# Patient Record
Sex: Male | Born: 1995 | Race: Black or African American | Hispanic: No | Marital: Single | Smoking: Never smoker
Health system: Southern US, Community
[De-identification: ages and names within clinical notes are randomized; demographics above are authoritative.]

---

## 2016-09-10 ENCOUNTER — Emergency Department
Admission: EM | Admit: 2016-09-10 | Discharge: 2016-09-10 | Disposition: A | Discharge status: Home / Self Care | Payer: Self-pay | Attending: Emergency Medicine | Admitting: Emergency Medicine

## 2016-09-10 ENCOUNTER — Encounter: Payer: Self-pay | Admitting: Emergency Medicine

## 2016-09-10 ENCOUNTER — Emergency Department: Payer: Self-pay

## 2016-09-10 DIAGNOSIS — Y929 Unspecified place or not applicable: Secondary | ICD-10-CM | POA: Insufficient documentation

## 2016-09-10 DIAGNOSIS — Y939 Activity, unspecified: Secondary | ICD-10-CM | POA: Insufficient documentation

## 2016-09-10 DIAGNOSIS — S61012A Laceration without foreign body of left thumb without damage to nail, initial encounter: Secondary | ICD-10-CM | POA: Insufficient documentation

## 2016-09-10 DIAGNOSIS — Y999 Unspecified external cause status: Secondary | ICD-10-CM | POA: Insufficient documentation

## 2016-09-10 DIAGNOSIS — W269XXA Contact with unspecified sharp object(s), initial encounter: Secondary | ICD-10-CM | POA: Insufficient documentation

## 2016-09-10 DIAGNOSIS — Z23 Encounter for immunization: Secondary | ICD-10-CM | POA: Insufficient documentation

## 2016-09-10 MED ORDER — TETANUS-DIPHTH-ACELL PERTUSSIS 5-2.5-18.5 LF-MCG/0.5 IM SUSP
0.5000 mL | Freq: Once | INTRAMUSCULAR | Status: AC
  Administered 2016-09-10: 0.5 mL via INTRAMUSCULAR
  Filled 2016-09-10: qty 0.5

## 2016-09-10 NOTE — ED Notes (Signed)
Pt discharged to home.  Discharge instructions reviewed.  Verbalized understanding.  No questions or concerns at this time.  Teach back verified.  Pt in NAD.  No items left in ED.   

## 2016-09-10 NOTE — ED Provider Notes (Signed)
[facility] Emergency Department Provider Note  ____________________________________________  Time seen: Approximately 5:10 PM  I have reviewed the triage vital signs and the nursing notes.   HISTORY  Chief Complaint Foreign Body    HPI Greg Benjamin is a 20 y.o. male presents emergency department complaining of possible foreign body to his left eye. Patient states that he believes a piece of glass is in his thumb. Thumb is lacerated approximately a week ago. The patient has a arcus spot underneath the skin with a palpable difference underlying the skin. Patient is also concerned he may need a tetanus shot as he does not know when his last tetanus shot was. Pain is described more as an irritation versus sharp pain. No medications prior to arrival.   History reviewed. No pertinent past medical history.  There are no active problems to display for this patient.   History reviewed. No pertinent surgical history.  Prior to Admission medications   Not on File    Allergies Review of patient's allergies indicates no known allergies.  No family history on file.  Social History Social History  Substance Use Topics  . Smoking status: Never Smoker  . Smokeless tobacco: Never Used  . Alcohol use No     Review of Systems  Constitutional: No fever/chills Cardiovascular: no chest pain. Respiratory: no cough. No SOB. Musculoskeletal: Negative for musculoskeletal pain. Skin: Positive for possible embedded foreign body into the left thumb Neurological: Negative for headaches, focal weakness or numbness. 10-point ROS otherwise negative.  ____________________________________________   PHYSICAL EXAM:  VITAL SIGNS: ED Triage Vitals  Enc Vitals Group     BP 09/10/16 1524 134/66     Pulse Rate 09/10/16 1524 68     Resp 09/10/16 1524 18     Temp 09/10/16 1524 98.3 F (36.8 C)     Temp Source 09/10/16 1524 Oral     SpO2 09/10/16 1524 98 %     Weight  09/10/16 1521 240 lb (108.9 kg)     Height 09/10/16 1521 5\' 8"  (1.727 m)     Head Circumference --      Peak Flow --      Pain Score 09/10/16 1519 3     Pain Loc --      Pain Edu? --      Excl. in GC? --      Constitutional: Alert and oriented. Well appearing and in no acute distress. Eyes: Conjunctivae are normal. PERRL. EOMI. Head: Atraumatic. Cardiovascular: Normal rate, regular rhythm. Normal S1 and S2.  Good peripheral circulation. Respiratory: Normal respiratory effort without tachypnea or retractions. Lungs CTAB. Good air entry to the bases with no decreased or absent breath sounds. Musculoskeletal: Full range of motion to all extremities. No gross deformities appreciated. Neurologic:  Normal speech and language. No gross focal neurologic deficits are appreciated.  Skin:  Skin is warm, dry and intact. No rash noted. Examination of the left thumb reveals a dark area under the skin of the left thumb pad. This appears to be consistent with blood blister. No open cuts or lesions at this time. No surrounding erythema or edema consistent with infection. Age motion of the thumb. Sensation and cap refill intact. Psychiatric: Mood and affect are normal. Speech and behavior are normal. Patient exhibits appropriate insight and judgement.   ____________________________________________   LABS (all labs ordered are listed, but only abnormal results are displayed)  Labs Reviewed - No data to display ____________________________________________  EKG   ____________________________________________  RADIOLOGY I, Delorise Royals Carolos Fecher, personally viewed and evaluated these images (plain radiographs) as part of my medical decision making, as well as reviewing the written report by the radiologist.  Dg Finger Thumb Left  Result Date: 09/10/2016 CLINICAL DATA:  Caught on left thumb 1 week ago. EXAM: LEFT THUMB 2+V COMPARISON:  None. FINDINGS: There is no evidence of fracture or dislocation.  There is no evidence of arthropathy or other focal bone abnormality. Soft tissue swelling along the tuft of the first digit. IMPRESSION: No acute osseous injury of the left thumb. Electronically Signed   By: Elige Ko   On: 09/10/2016 16:44    ____________________________________________    PROCEDURES  Procedure(s) performed:    Procedures    Medications  Tdap (BOOSTRIX) injection 0.5 mL (not administered)     ____________________________________________   INITIAL IMPRESSION / ASSESSMENT AND PLAN / ED COURSE  Pertinent labs & imaging results that were available during my care of the patient were reviewed by me and considered in my medical decision making (see chart for details).  Review of the [place] CSRS was performed in accordance of the [facility] prior to dispensing any controlled drugs.  Clinical Course    Patient's diagnosis is consistent with Thumb injury with underlying blood blister. No indication of foreign body at this time. Patient's tetanus shot is updated at this time. No wound revision is necessary..  Patient is given ED precautions to return to the ED for any worsening or new symptoms.     ____________________________________________  FINAL CLINICAL IMPRESSION(S) / ED DIAGNOSES  Final diagnoses:  Laceration of left thumb without foreign body without damage to nail, initial encounter      NEW MEDICATIONS STARTED DURING THIS VISIT:  New Prescriptions   No medications on file        This chart was dictated using voice recognition software/Dragon. Despite best efforts to proofread, errors can occur which can change the meaning. Any change was purely unintentional.    Racheal Patches, PA-C 09/10/16 1716    Rockne Menghini, MD 09/10/16 [phone]

## 2016-09-10 NOTE — ED Notes (Signed)
Pt cut right thumb last week and the last couple of days started hurting and realized he had a piece of glass in it. Not sure if tetanus shot is up to date.

## 2017-04-30 ENCOUNTER — Emergency Department
Admission: EM | Admit: 2017-04-30 | Discharge: 2017-04-30 | Disposition: A | Discharge status: Home / Self Care | Payer: Self-pay | Attending: Student in an Organized Health Care Education/Training Program | Admitting: Student in an Organized Health Care Education/Training Program

## 2017-04-30 ENCOUNTER — Emergency Department: Payer: Self-pay

## 2017-04-30 DIAGNOSIS — M25531 Pain in right wrist: Secondary | ICD-10-CM

## 2017-04-30 DIAGNOSIS — Y998 Other external cause status: Secondary | ICD-10-CM | POA: Insufficient documentation

## 2017-04-30 DIAGNOSIS — W2111XA Struck by baseball bat, initial encounter: Secondary | ICD-10-CM | POA: Insufficient documentation

## 2017-04-30 DIAGNOSIS — Y9364 Activity, baseball: Secondary | ICD-10-CM | POA: Insufficient documentation

## 2017-04-30 DIAGNOSIS — Y92009 Unspecified place in unspecified non-institutional (private) residence as the place of occurrence of the external cause: Secondary | ICD-10-CM | POA: Insufficient documentation

## 2017-04-30 DIAGNOSIS — S60221A Contusion of right hand, initial encounter: Secondary | ICD-10-CM | POA: Insufficient documentation

## 2017-04-30 MED ORDER — NAPROXEN 500 MG PO TABS
500.0000 mg | ORAL_TABLET | Freq: Once | ORAL | Status: AC
  Administered 2017-04-30: 500 mg via ORAL

## 2017-04-30 MED ORDER — NAPROXEN 500 MG PO TABS: 500.0000 mg | ORAL_TABLET | Freq: Two times a day (BID) | ORAL | 0 refills | Status: AC

## 2017-04-30 NOTE — ED Triage Notes (Signed)
Pt arrives to ED via POV with c/o RIGHT hand and wrist pain s/p "getting hit by a steel baseball bat accidentally while playing ball.

## 2017-04-30 NOTE — ED Provider Notes (Signed)
[facility] Emergency Department Provider Note    First MD Initiated Contact with Patient 04/30/17 [phone]     (approximate)  I have reviewed the triage vital signs and the nursing notes.   HISTORY  Chief Complaint Hand Injury and Wrist Injury    HPI Greg Benjamin is a 21 y.o. male presents with complaint of right hand and wrist pain that occurred after being struck in that hand by a baseball bat while he is playing baseball this evening at home with his friends. Patient had been drinking friends. He is Physicist, medicalplaying catcher. Stuck his hand out to catch the ball with swelling in his hand. Describes diffuse pain to the back of his right hand. He is right-hand dominant. No other associated injuries.   History reviewed. No pertinent past medical history. No family history on file. History reviewed. No pertinent surgical history. There are no active problems to display for this patient.     Prior to Admission medications   Medication Sig Start Date End Date Taking? Authorizing Provider  naproxen (NAPROSYN) 500 MG tablet Take 1 tablet (500 mg total) by mouth 2 (two) times daily with a meal. 04/30/17 04/30/18  Willy Eddyobinson, Kanitra Purifoy, MD    Allergies Patient has no known allergies.    Social History Social History  Substance Use Topics  . Smoking status: Never Smoker  . Smokeless tobacco: Never Used  . Alcohol use No    Review of Systems Patient denies headaches, rhinorrhea, blurry vision, numbness, shortness of breath, chest pain, edema, cough, abdominal pain, nausea, vomiting, diarrhea, dysuria, fevers, rashes or hallucinations unless otherwise stated above in HPI. ____________________________________________   PHYSICAL EXAM:  VITAL SIGNS: Vitals:   04/30/17 0237  BP: (!) 154/75  Pulse: 98  Resp: 18  Temp: 98.4 F (36.9 C)    Constitutional: Alert and oriented. Well appearing and in no acute distress. Eyes: Conjunctivae are normal.  Head:  Atraumatic. Nose: No congestion/rhinnorhea. Mouth/Throat: Mucous membranes are moist.   Neck: Painless ROM.  Cardiovascular:   Good peripheral circulation. Respiratory: Normal respiratory effort.  No retractions.  Gastrointestinal: Soft and nontender.  Musculoskeletal: No lower extremity tenderness .  No joint effusions.  Right hand ttp and contusion to posterior hand.  No snuff box ttp.  Extensor and flexor mechanisms intact.  SILT distally Neurologic:  Normal speech and language. No gross focal neurologic deficits are appreciated.  Skin:  Skin is warm, dry and intact. No rash noted. Psychiatric: Mood and affect are normal. Speech and behavior are normal.  ____________________________________________   LABS (all labs ordered are listed, but only abnormal results are displayed)  No results found for this or any previous visit (from the past 24 hour(s)). ____________________________________________ ____________________________________________  RADIOLOGY  I personally reviewed all radiographic images ordered to evaluate for the above acute complaints and reviewed radiology reports and findings.  These findings were personally discussed with the patient.  Please see medical record for radiology report.  ____________________________________________   PROCEDURES  Procedure(s) performed:  Procedures    Critical Care performed: no ____________________________________________   INITIAL IMPRESSION / ASSESSMENT AND PLAN / ED COURSE  Pertinent labs & imaging results that were available during my care of the patient were reviewed by me and considered in my medical decision making (see chart for details).  DDX: fracture, contusion, sprain  Greg Benjamin is a 21 y.o. who presents to the ED with acute right wrist injury. Denies any other injuries. Denies motor or sensory loss. Denies paresthesias. VSS  in ED. Exam as above. NV intact throughout and distal to injury. Cap refill <2  seconds. Pt able to range joint. TTP at wrist but no anatomical snuffbox tenderness. No clinical suspicion for infectious process or septic joint. More consistent with contusion/sprain/fracture.  Treatments will include observation, X-rays.  XR negative.    Discussed supportive care, wrist splint, and follow up with pt.       ____________________________________________   FINAL CLINICAL IMPRESSION(S) / ED DIAGNOSES  Final diagnoses:  Contusion of right hand, initial encounter  Wrist pain, acute, right      NEW MEDICATIONS STARTED DURING THIS VISIT:  New Prescriptions   NAPROXEN (NAPROSYN) 500 MG TABLET    Take 1 tablet (500 mg total) by mouth 2 (two) times daily with a meal.     Note:  This document was prepared using Dragon voice recognition software and may include unintentional dictation errors.     Willy Eddy, MD 04/30/17 [phone]

## 2017-11-14 ENCOUNTER — Encounter: Payer: Self-pay | Admitting: Emergency Medicine

## 2017-11-14 ENCOUNTER — Emergency Department
Admission: EM | Admit: 2017-11-14 | Discharge: 2017-11-14 | Disposition: A | Discharge status: Home / Self Care | Payer: Self-pay | Attending: Emergency Medicine | Admitting: Emergency Medicine

## 2017-11-14 ENCOUNTER — Other Ambulatory Visit: Payer: Self-pay

## 2017-11-14 DIAGNOSIS — Z5321 Procedure and treatment not carried out due to patient leaving prior to being seen by health care provider: Secondary | ICD-10-CM | POA: Insufficient documentation

## 2017-11-14 DIAGNOSIS — R05 Cough: Secondary | ICD-10-CM | POA: Insufficient documentation

## 2017-11-14 NOTE — ED Triage Notes (Signed)
Pt arrives ambulatory to triage with c/o cough x 2 weeks with associated URI symptoms. Pt is in NAD.

## 2017-11-14 NOTE — ED Notes (Signed)
Pt brought back to room and placed in room. Pt in NAD at this time. No SOB noted. Pts friends arrived and pt reported he was leaving the treatment room he did not have the time to stay. Pt was pleasant when stating this and reported he felt as though he was safe to go home.

## 2018-02-18 ENCOUNTER — Other Ambulatory Visit: Payer: Self-pay

## 2018-02-18 ENCOUNTER — Emergency Department
Admission: EM | Admit: 2018-02-18 | Discharge: 2018-02-18 | Disposition: A | Discharge status: Home / Self Care | Payer: Self-pay | Attending: Emergency Medicine | Admitting: Emergency Medicine

## 2018-02-18 ENCOUNTER — Encounter: Payer: Self-pay | Admitting: Emergency Medicine

## 2018-02-18 DIAGNOSIS — R1032 Left lower quadrant pain: Secondary | ICD-10-CM

## 2018-02-18 DIAGNOSIS — F17210 Nicotine dependence, cigarettes, uncomplicated: Secondary | ICD-10-CM | POA: Insufficient documentation

## 2018-02-18 DIAGNOSIS — Z79899 Other long term (current) drug therapy: Secondary | ICD-10-CM | POA: Insufficient documentation

## 2018-02-18 DIAGNOSIS — J45909 Unspecified asthma, uncomplicated: Secondary | ICD-10-CM | POA: Insufficient documentation

## 2018-02-18 DIAGNOSIS — A749 Chlamydial infection, unspecified: Secondary | ICD-10-CM | POA: Insufficient documentation

## 2018-02-18 LAB — COMPREHENSIVE METABOLIC PANEL
ALBUMIN: 4 g/dL (ref 3.5–5.0)
ALT: 24 U/L (ref 17–63)
ANION GAP: 8 (ref 5–15)
AST: 22 U/L (ref 15–41)
Alkaline Phosphatase: 59 U/L (ref 38–126)
BUN: 15 mg/dL (ref 6–20)
CO2: 27 mmol/L (ref 22–32)
Calcium: 8.9 mg/dL (ref 8.9–10.3)
Chloride: 103 mmol/L (ref 101–111)
Creatinine, Ser: 1.09 mg/dL (ref 0.61–1.24)
GFR calc Af Amer: >60 mL/min (ref >60)
GFR calc non Af Amer: >60 mL/min (ref >60)
GLUCOSE: 104 mg/dL — AB (ref 65–99)
POTASSIUM: 4.2 mmol/L (ref 3.5–5.1)
Sodium: 138 mmol/L (ref 135–145)
TOTAL PROTEIN: 7.8 g/dL (ref 6.5–8.1)
Total Bilirubin: 0.5 mg/dL (ref 0.3–1.2)

## 2018-02-18 LAB — CBC
HCT: 43.6 % (ref 40.0–52.0)
Hemoglobin: 14.7 g/dL (ref 13.0–18.0)
MCH: 28.9 pg (ref 26.0–34.0)
MCHC: 33.6 g/dL (ref 32.0–36.0)
MCV: 86 fL (ref 80.0–100.0)
Platelets: 300 10*3/uL (ref 150–440)
RBC: 5.07 MIL/uL (ref 4.40–5.90)
RDW: 14.7 % — AB (ref 11.5–14.5)
WBC: 5.6 10*3/uL (ref 3.8–10.6)

## 2018-02-18 LAB — LIPASE, BLOOD: Lipase: 23 U/L (ref 11–51)

## 2018-02-18 LAB — CHLAMYDIA/NGC RT PCR (ARMC ONLY)
Chlamydia Tr: DETECTED — AB
N GONORRHOEAE: NOT DETECTED

## 2018-02-18 MED ORDER — ONDANSETRON 4 MG PO TBDP
4.0000 mg | ORAL_TABLET | Freq: Once | ORAL | Status: AC
  Administered 2018-02-18: 4 mg via ORAL
  Filled 2018-02-18: qty 1

## 2018-02-18 MED ORDER — AZITHROMYCIN 500 MG PO TABS
1000.0000 mg | ORAL_TABLET | Freq: Once | ORAL | Status: AC
  Administered 2018-02-18: 1000 mg via ORAL
  Filled 2018-02-18: qty 2

## 2018-02-18 MED ORDER — IBUPROFEN 600 MG PO TABS
600.0000 mg | ORAL_TABLET | Freq: Once | ORAL | Status: AC
  Administered 2018-02-18: 600 mg via ORAL
  Filled 2018-02-18: qty 1

## 2018-02-18 NOTE — ED Triage Notes (Signed)
Pt presents with generalized abdominal pain x 2 weeks. States nothing makes it better or worse. Reports decreased appetite, nausea; denies emesis, endorses "a little bit of diarrhea." Pt reports that his girlfriend has gonorrhea and he is concerned that he may have it as well. Pt alert & oriented with NAD noted.

## 2018-02-18 NOTE — Discharge Instructions (Signed)

## 2018-02-18 NOTE — ED Notes (Signed)
Pt discharged to home.  Family member driving.  Discharge instructions reviewed.  Verbalized understanding.  No questions or concerns at this time.  Teach back verified.  Pt in NAD.  No items left in ED.   

## 2018-02-18 NOTE — ED Notes (Signed)
Upon arriving back to bed, pt states that he is really only here to be checked out for STD's.  States his girlfriend was dx with gonorrhea 3 days ago and he thinks that he has it.  Pt is A&Ox4, in NAD at this time.

## 2018-02-18 NOTE — ED Provider Notes (Addendum)
[facility] Emergency Department Provider Note  ____________________________________________  Time seen: Approximately 9:09 PM  I have reviewed the triage vital signs and the nursing notes.   HISTORY  Chief Complaint Abdominal Pain   HPI Greg Benjamin is a 22 y.o. male with a history of migraine, PTSD, and asthma who presents for evaluation of abdominal pain.  Patient reports 1 week of cramping intermittent moderate abdominal pain that is located in the lower quadrants but worse on the left.  At this time he endorses 3 out of 10 pain.  Has not taken anything at home for it.  Has had some nausea but no vomiting.  Had one episode of watery stool.  Has been having normal bowel movements.  No fever or chills, no dysuria or hematuria.  Patient was told by his girlfriend that she was positive for gonorrhea 3 days ago and is requesting STD testing.  He denies testicular pain, swelling, penile discharge.  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 Authorizing Provider  naproxen (NAPROSYN) 500 MG tablet Take 1 tablet (500 mg total) by mouth 2 (two) times daily with a meal. 04/30/17 04/30/18  Willy Eddy, MD    Allergies Patient has no known allergies.  FH ADD / ADHD Father   Social History Social History   Tobacco Use  . Smoking status: Current Every Day Smoker    Packs/day: 0.50    Types: Cigarettes  . Smokeless tobacco: Never Used  Substance Use Topics  . Alcohol use: Yes    Comment: 3-4 x week/ 1/5 of liquor  . Drug use: Yes    Types: Marijuana    Review of Systems  Constitutional: Negative for fever. Eyes: Negative for visual changes. ENT: Negative for sore throat. Neck: No  neck pain  Cardiovascular: Negative for chest pain. Respiratory: Negative for shortness of breath. Gastrointestinal: + abdominal pain and nausea. No vomiting or diarrhea. Genitourinary: Negative for dysuria. Musculoskeletal: Negative for back pain. Skin: Negative for rash. Neurological: Negative for headaches, weakness or numbness. Psych: No SI or HI  ____________________________________________   PHYSICAL EXAM:  VITAL SIGNS: ED Triage Vitals  Enc Vitals Group     BP 02/18/18 1813 126/71     Pulse Rate 02/18/18 1813 76     Resp 02/18/18 1813 18     Temp 02/18/18 1813 98.9 F (37.2 C)     Temp Source 02/18/18 1813 Oral     SpO2 02/18/18 1813 97 %     Weight 02/18/18 1813 250 lb (113.4 kg)     Height 02/18/18 1813 5\' 8"  (1.727 m)     Head Circumference --      Peak Flow --      Pain Score 02/18/18 1830 8     Pain Loc --      Pain Edu? --      Excl. in GC? --     Constitutional: Alert and oriented. Well appearing and in no apparent distress. HEENT:      Head: Normocephalic and atraumatic.         Eyes: Conjunctivae are normal. Sclera is non-icteric.       Mouth/Throat: Mucous membranes are moist.       Neck: Supple with no signs of meningismus. Cardiovascular: Regular rate  and rhythm. No murmurs, gallops, or rubs. 2+ symmetrical distal pulses are present in all extremities. No JVD. Respiratory: Normal respiratory effort. Lungs are clear to auscultation bilaterally. No wheezes, crackles, or rhonchi.  Gastrointestinal: Obese, mild left quadrant tenderness only, non tender, and non distended with positive bowel sounds. No rebound or guarding. Genitourinary: No CVA tenderness. Musculoskeletal: Nontender with normal range of motion in all extremities. No edema, cyanosis, or erythema of extremities. Neurologic: Normal speech and language. Face is symmetric. Moving all extremities. No gross focal neurologic deficits are appreciated. Skin: Skin is warm, dry and intact. No rash  noted. Psychiatric: Mood and affect are normal. Speech and behavior are normal.  ____________________________________________   LABS (all labs ordered are listed, but only abnormal results are displayed)  Labs Reviewed  CHLAMYDIA/NGC RT PCR ([facility] ONLY) - Abnormal; Notable for the following components:      Result Value   Chlamydia Tr DETECTED (*)    All other components within normal limits  COMPREHENSIVE METABOLIC PANEL - Abnormal; Notable for the following components:   Glucose, Bld 104 (*)    All other components within normal limits  CBC - Abnormal; Notable for the following components:   RDW 14.7 (*)    All other components within normal limits  LIPASE, BLOOD  URINALYSIS, COMPLETE (UACMP) WITH MICROSCOPIC   ____________________________________________  EKG  ED ECG REPORT I, Nita Sicklearolina Alean Kromer, the attending physician, personally viewed and interpreted this ECG.  Normal sinus rhythm, rate of 75, normal intervals, normal axis, no ST elevations or depressions.  Normal EKG. ____________________________________________  RADIOLOGY  none  ____________________________________________   PROCEDURES  Procedure(s) performed: None Procedures Critical Care performed:  None ____________________________________________   INITIAL IMPRESSION / ASSESSMENT AND PLAN / ED COURSE   22 y.o. male with a history of migraine, PTSD, and asthma who presents for evaluation of abdominal pain and concerns for STD.  Patient is well-appearing, no distress, normal vital signs, abdomen is soft with mild left-sided tenderness, no rebound or guarding, labs are within normal limits.  Urine was tested for STD and positive for chlamydia.  Patient was treated with azithromycin 1000 mg p.o.  Recommended treatment of his sexual partners to prevent reinfection.  At this time with normal labs and a reassuring abdominal exam did not believe patient benefits from any imaging.  Patient is going to be  discharged home on ibuprofen and follow-up with primary care doctor.  Discussed return precautions with patient.  Clinically there is no evidence of gallbladder disease, diverticulitis, appendicitis or any other acute intra-abdominal process.      As part of my medical decision making, I reviewed the following data within the electronic MEDICAL RECORD NUMBER Nursing notes reviewed and incorporated, Labs reviewed , Notes from prior ED visits and  Controlled Substance Database    Pertinent labs & imaging results that were available during my care of the patient were reviewed by me and considered in my medical decision making (see chart for details).    ____________________________________________   FINAL CLINICAL IMPRESSION(S) / ED DIAGNOSES  Final diagnoses:  Chlamydia      NEW MEDICATIONS STARTED DURING THIS VISIT:  ED Discharge Orders    None       Note:  This document was prepared using Dragon voice recognition software and may include unintentional dictation errors.    Nita SickleVeronese, [place], MD 02/18/18 2114    Nita SickleVeronese, [place], MD 02/18/18 2115

## 2018-02-25 ENCOUNTER — Emergency Department: Payer: Self-pay

## 2018-02-25 ENCOUNTER — Other Ambulatory Visit: Payer: Self-pay

## 2018-02-25 ENCOUNTER — Emergency Department
Admission: EM | Admit: 2018-02-25 | Discharge: 2018-02-25 | Disposition: A | Discharge status: Home / Self Care | Payer: Self-pay | Attending: Emergency Medicine | Admitting: Emergency Medicine

## 2018-02-25 DIAGNOSIS — Y999 Unspecified external cause status: Secondary | ICD-10-CM | POA: Insufficient documentation

## 2018-02-25 DIAGNOSIS — M546 Pain in thoracic spine: Secondary | ICD-10-CM

## 2018-02-25 DIAGNOSIS — F17210 Nicotine dependence, cigarettes, uncomplicated: Secondary | ICD-10-CM | POA: Insufficient documentation

## 2018-02-25 DIAGNOSIS — Y9389 Activity, other specified: Secondary | ICD-10-CM | POA: Insufficient documentation

## 2018-02-25 DIAGNOSIS — F10929 Alcohol use, unspecified with intoxication, unspecified: Secondary | ICD-10-CM

## 2018-02-25 DIAGNOSIS — Z23 Encounter for immunization: Secondary | ICD-10-CM | POA: Insufficient documentation

## 2018-02-25 DIAGNOSIS — R55 Syncope and collapse: Secondary | ICD-10-CM

## 2018-02-25 DIAGNOSIS — F1910 Other psychoactive substance abuse, uncomplicated: Secondary | ICD-10-CM

## 2018-02-25 DIAGNOSIS — W0110XA Fall on same level from slipping, tripping and stumbling with subsequent striking against unspecified object, initial encounter: Secondary | ICD-10-CM | POA: Insufficient documentation

## 2018-02-25 DIAGNOSIS — S0181XA Laceration without foreign body of other part of head, initial encounter: Secondary | ICD-10-CM

## 2018-02-25 DIAGNOSIS — Y929 Unspecified place or not applicable: Secondary | ICD-10-CM | POA: Insufficient documentation

## 2018-02-25 LAB — COMPREHENSIVE METABOLIC PANEL
ALK PHOS: 73 U/L (ref 38–126)
ALT: 19 U/L (ref 17–63)
ANION GAP: 5 (ref 5–15)
AST: 24 U/L (ref 15–41)
Albumin: 3.6 g/dL (ref 3.5–5.0)
BILIRUBIN TOTAL: 0.4 mg/dL (ref 0.3–1.2)
BUN: 8 mg/dL (ref 6–20)
CALCIUM: 8.4 mg/dL — AB (ref 8.9–10.3)
CO2: 25 mmol/L (ref 22–32)
Chloride: 108 mmol/L (ref 101–111)
Creatinine, Ser: 0.98 mg/dL (ref 0.61–1.24)
GFR calc Af Amer: >60 mL/min (ref >60)
GFR calc non Af Amer: >60 mL/min (ref >60)
GLUCOSE: 97 mg/dL (ref 65–99)
Potassium: 3.5 mmol/L (ref 3.5–5.1)
Sodium: 138 mmol/L (ref 135–145)
TOTAL PROTEIN: 6.5 g/dL (ref 6.5–8.1)

## 2018-02-25 LAB — URINE DRUG SCREEN, QUALITATIVE (ARMC ONLY)
AMPHETAMINES, UR SCREEN: NOT DETECTED
BENZODIAZEPINE, UR SCRN: NOT DETECTED
Barbiturates, Ur Screen: NOT DETECTED
Cannabinoid 50 Ng, Ur ~~LOC~~: POSITIVE — AB
Cocaine Metabolite,Ur ~~LOC~~: POSITIVE — AB
MDMA (Ecstasy)Ur Screen: NOT DETECTED
METHADONE SCREEN, URINE: NOT DETECTED
Opiate, Ur Screen: NOT DETECTED
PHENCYCLIDINE (PCP) UR S: NOT DETECTED
Tricyclic, Ur Screen: NOT DETECTED

## 2018-02-25 LAB — CBC
HCT: 38.4 % — ABNORMAL LOW (ref 40.0–52.0)
HEMOGLOBIN: 12.6 g/dL — AB (ref 13.0–18.0)
MCH: 26.7 pg (ref 26.0–34.0)
MCHC: 32.7 g/dL (ref 32.0–36.0)
MCV: 81.5 fL (ref 80.0–100.0)
Platelets: 234 10*3/uL (ref 150–440)
RBC: 4.71 MIL/uL (ref 4.40–5.90)
RDW: 15.6 % — ABNORMAL HIGH (ref 11.5–14.5)
WBC: 6.9 10*3/uL (ref 3.8–10.6)

## 2018-02-25 LAB — ETHANOL: ALCOHOL ETHYL (B): 115 mg/dL — AB (ref <10)

## 2018-02-25 MED ORDER — BACITRACIN-NEOMYCIN-POLYMYXIN 400-5-5000 EX OINT: TOPICAL_OINTMENT | Freq: Once | CUTANEOUS | Status: DC

## 2018-02-25 MED ORDER — BACITRACIN ZINC 500 UNIT/GM EX OINT: TOPICAL_OINTMENT | Freq: Once | CUTANEOUS | Status: DC

## 2018-02-25 MED ORDER — LIDOCAINE-EPINEPHRINE-TETRACAINE (LET) SOLUTION
3.0000 mL | Freq: Once | NASAL | Status: AC
  Administered 2018-02-25: 3 mL via TOPICAL

## 2018-02-25 MED ORDER — LIDOCAINE-EPINEPHRINE-TETRACAINE (LET) SOLUTION
NASAL | Status: AC
  Filled 2018-02-25: qty 3

## 2018-02-25 MED ORDER — LIDOCAINE-EPINEPHRINE 1 %-1:100000 IJ SOLN
20.0000 mL | Freq: Once | INTRAMUSCULAR | Status: AC
  Administered 2018-02-25: 20 mL via INTRADERMAL
  Filled 2018-02-25: qty 20

## 2018-02-25 MED ORDER — LIDOCAINE-EPINEPHRINE (PF) 1 %-1:200000 IJ SOLN
INTRAMUSCULAR | Status: AC
  Filled 2018-02-25: qty 30

## 2018-02-25 MED ORDER — THIAMINE HCL 100 MG/ML IJ SOLN
Freq: Once | INTRAVENOUS | Status: AC
  Administered 2018-02-25: 10:00:00 via INTRAVENOUS
  Filled 2018-02-25: qty 1000

## 2018-02-25 MED ORDER — SODIUM CHLORIDE 0.9 % IV BOLUS
1000.0000 mL | Freq: Once | INTRAVENOUS | Status: AC
  Administered 2018-02-25: 1000 mL via INTRAVENOUS

## 2018-02-25 MED ORDER — TETANUS-DIPHTH-ACELL PERTUSSIS 5-2.5-18.5 LF-MCG/0.5 IM SUSP
0.5000 mL | Freq: Once | INTRAMUSCULAR | Status: AC
  Administered 2018-02-25: 0.5 mL via INTRAMUSCULAR
  Filled 2018-02-25: qty 0.5

## 2018-02-25 MED ORDER — BACITRACIN ZINC 500 UNIT/GM EX OINT
TOPICAL_OINTMENT | CUTANEOUS | Status: AC
  Filled 2018-02-25: qty 0.9

## 2018-02-25 NOTE — Discharge Instructions (Addendum)
Please make a follow-up appointment with your primary care physician for suture removal in 5-7 days.  In the meantime, please apply Neosporin, bacitracin, or any triple antibiotic ointment to your laceration and a thick coat 3 times daily.  Once your laceration has completely healed, you may apply 50 SPF sunscreen daily and shield your laceration from the sun to decrease scarring.  Return to the emergency department if you develop severe pain, vomiting, inability to walk, neck pain, or any other symptoms concerning to you.

## 2018-02-25 NOTE — ED Triage Notes (Signed)
He arrives today via [facility] from home with reports of drinking alcohol last night and he then fell and hit his head on the sink this am  pt unsure if LOC   Laceration to his right forehead covered upon arrival  Several small abrasions noted to his cheeks

## 2018-02-25 NOTE — ED Notes (Signed)
Pt to CT
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
## 2018-02-25 NOTE — ED Notes (Signed)
He has ambulated to and from the BR with a steady gait - he has used the telephone to call for a ride home  - pt to discharge

## 2018-07-12 ENCOUNTER — Other Ambulatory Visit: Payer: Self-pay

## 2018-07-12 ENCOUNTER — Emergency Department: Payer: Self-pay

## 2018-07-12 ENCOUNTER — Encounter: Payer: Self-pay | Admitting: Emergency Medicine

## 2018-07-12 DIAGNOSIS — Y999 Unspecified external cause status: Secondary | ICD-10-CM | POA: Insufficient documentation

## 2018-07-12 DIAGNOSIS — M7918 Myalgia, other site: Secondary | ICD-10-CM | POA: Insufficient documentation

## 2018-07-12 DIAGNOSIS — Y939 Activity, unspecified: Secondary | ICD-10-CM | POA: Insufficient documentation

## 2018-07-12 DIAGNOSIS — Y92410 Unspecified street and highway as the place of occurrence of the external cause: Secondary | ICD-10-CM | POA: Insufficient documentation

## 2018-07-12 DIAGNOSIS — F17210 Nicotine dependence, cigarettes, uncomplicated: Secondary | ICD-10-CM | POA: Insufficient documentation

## 2018-07-12 DIAGNOSIS — S60221A Contusion of right hand, initial encounter: Secondary | ICD-10-CM | POA: Insufficient documentation
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
## 2018-07-13 ENCOUNTER — Emergency Department
Admission: EM | Admit: 2018-07-13 | Discharge: 2018-07-13 | Disposition: A | Discharge status: Home / Self Care | Payer: Self-pay | Attending: Emergency Medicine | Admitting: Emergency Medicine

## 2018-07-13 DIAGNOSIS — M7918 Myalgia, other site: Secondary | ICD-10-CM

## 2018-07-13 DIAGNOSIS — T1490XA Injury, unspecified, initial encounter: Secondary | ICD-10-CM

## 2018-07-13 DIAGNOSIS — S60221A Contusion of right hand, initial encounter: Secondary | ICD-10-CM

## 2018-07-13 MED ORDER — KETOROLAC TROMETHAMINE 60 MG/2ML IM SOLN
60.0000 mg | Freq: Once | INTRAMUSCULAR | Status: AC
  Administered 2018-07-13: 60 mg via INTRAMUSCULAR
  Filled 2018-07-13: qty 2

## 2018-07-13 MED ORDER — TRAMADOL HCL 50 MG PO TABS: 50.0000 mg | ORAL_TABLET | Freq: Four times a day (QID) | ORAL | 0 refills | Status: DC | PRN
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
## 2022-01-10 ENCOUNTER — Emergency Department: Payer: Self-pay

## 2022-01-10 ENCOUNTER — Observation Stay
Admission: EM | Admit: 2022-01-10 | Discharge: 2022-01-11 | Disposition: A | Discharge status: Home / Self Care | Payer: Self-pay | Attending: Internal Medicine | Admitting: Internal Medicine

## 2022-01-10 DIAGNOSIS — N179 Acute kidney failure, unspecified: Secondary | ICD-10-CM | POA: Diagnosis present

## 2022-01-10 DIAGNOSIS — E876 Hypokalemia: Secondary | ICD-10-CM | POA: Diagnosis present

## 2022-01-10 DIAGNOSIS — R7989 Other specified abnormal findings of blood chemistry: Secondary | ICD-10-CM | POA: Diagnosis present

## 2022-01-10 DIAGNOSIS — T50911A Poisoning by multiple unspecified drugs, medicaments and biological substances, accidental (unintentional), initial encounter: Principal | ICD-10-CM | POA: Insufficient documentation

## 2022-01-10 DIAGNOSIS — F17210 Nicotine dependence, cigarettes, uncomplicated: Secondary | ICD-10-CM | POA: Insufficient documentation

## 2022-01-10 DIAGNOSIS — J9600 Acute respiratory failure, unspecified whether with hypoxia or hypercapnia: Secondary | ICD-10-CM

## 2022-01-10 DIAGNOSIS — R778 Other specified abnormalities of plasma proteins: Secondary | ICD-10-CM

## 2022-01-10 DIAGNOSIS — F10929 Alcohol use, unspecified with intoxication, unspecified: Secondary | ICD-10-CM | POA: Diagnosis present

## 2022-01-10 DIAGNOSIS — T50901A Poisoning by unspecified drugs, medicaments and biological substances, accidental (unintentional), initial encounter: Secondary | ICD-10-CM | POA: Diagnosis present

## 2022-01-10 DIAGNOSIS — R4182 Altered mental status, unspecified: Secondary | ICD-10-CM | POA: Insufficient documentation

## 2022-01-10 DIAGNOSIS — G9340 Encephalopathy, unspecified: Secondary | ICD-10-CM | POA: Diagnosis present

## 2022-01-10 DIAGNOSIS — Z20822 Contact with and (suspected) exposure to covid-19: Secondary | ICD-10-CM | POA: Insufficient documentation

## 2022-01-10 LAB — CBC WITH DIFFERENTIAL/PLATELET
Abs Immature Granulocytes: 0.03 10*3/uL (ref 0.00–0.07)
Basophils Absolute: 0 10*3/uL (ref 0.0–0.1)
Basophils Relative: 0 %
Eosinophils Absolute: 0.3 10*3/uL (ref 0.0–0.5)
Eosinophils Relative: 3 %
HCT: 45.4 % (ref 39.0–52.0)
Hemoglobin: 14 g/dL (ref 13.0–17.0)
Immature Granulocytes: 0 %
Lymphocytes Relative: 16 %
Lymphs Abs: 1.6 10*3/uL (ref 0.7–4.0)
MCH: 25.2 pg — ABNORMAL LOW (ref 26.0–34.0)
MCHC: 30.8 g/dL (ref 30.0–36.0)
MCV: 81.7 fL (ref 80.0–100.0)
Monocytes Absolute: 0.8 10*3/uL (ref 0.1–1.0)
Monocytes Relative: 8 %
Neutro Abs: 7.3 10*3/uL (ref 1.7–7.7)
Neutrophils Relative %: 73 %
Platelets: 275 10*3/uL (ref 150–400)
RBC: 5.56 MIL/uL (ref 4.22–5.81)
RDW: 14.4 % (ref 11.5–15.5)
WBC: 10.1 10*3/uL (ref 4.0–10.5)
nRBC: 0 % (ref 0.0–0.2)

## 2022-01-10 LAB — COMPREHENSIVE METABOLIC PANEL
ALT: 30 U/L (ref 0–44)
AST: 29 U/L (ref 15–41)
Albumin: 4.3 g/dL (ref 3.5–5.0)
Alkaline Phosphatase: 85 U/L (ref 38–126)
Anion gap: 12 (ref 5–15)
BUN: 14 mg/dL (ref 6–20)
CO2: 25 mmol/L (ref 22–32)
Calcium: 9.1 mg/dL (ref 8.9–10.3)
Chloride: 101 mmol/L (ref 98–111)
Creatinine, Ser: 1.51 mg/dL — ABNORMAL HIGH (ref 0.61–1.24)
GFR, Estimated: >60 mL/min (ref >60)
Glucose, Bld: 79 mg/dL (ref 70–99)
Potassium: 3.4 mmol/L — ABNORMAL LOW (ref 3.5–5.1)
Sodium: 138 mmol/L (ref 135–145)
Total Bilirubin: 0.6 mg/dL (ref 0.3–1.2)
Total Protein: 7.7 g/dL (ref 6.5–8.1)

## 2022-01-10 LAB — URINE DRUG SCREEN, QUALITATIVE (ARMC ONLY)
Amphetamines, Ur Screen: NOT DETECTED
Barbiturates, Ur Screen: NOT DETECTED
Benzodiazepine, Ur Scrn: NOT DETECTED
Cannabinoid 50 Ng, Ur ~~LOC~~: POSITIVE — AB
Cocaine Metabolite,Ur ~~LOC~~: POSITIVE — AB
MDMA (Ecstasy)Ur Screen: NOT DETECTED
Methadone Scn, Ur: NOT DETECTED
Opiate, Ur Screen: NOT DETECTED
Phencyclidine (PCP) Ur S: NOT DETECTED
Tricyclic, Ur Screen: NOT DETECTED

## 2022-01-10 LAB — CBG MONITORING, ED: Glucose-Capillary: 86 mg/dL (ref 70–99)

## 2022-01-10 LAB — CK: Total CK: 280 U/L (ref 49–397)

## 2022-01-10 LAB — TROPONIN I (HIGH SENSITIVITY): Troponin I (High Sensitivity): 12 ng/L (ref <18)

## 2022-01-10 LAB — ETHANOL: Alcohol, Ethyl (B): 86 mg/dL — ABNORMAL HIGH (ref <10)

## 2022-01-10 MED ORDER — NALOXONE HCL 4 MG/10ML IJ SOLN
1.0000 mg/h | INTRAVENOUS | Status: DC
  Filled 2022-01-10: qty 10

## 2022-01-10 MED ORDER — SODIUM CHLORIDE 0.9 % IV BOLUS (SEPSIS)
1000.0000 mL | Freq: Once | INTRAVENOUS | Status: AC
  Administered 2022-01-11: 1000 mL via INTRAVENOUS

## 2022-01-10 MED ORDER — POTASSIUM CHLORIDE CRYS ER 20 MEQ PO TBCR
40.0000 meq | EXTENDED_RELEASE_TABLET | Freq: Once | ORAL | Status: AC
  Administered 2022-01-11: 40 meq via ORAL
  Filled 2022-01-10: qty 2

## 2022-01-10 MED ORDER — NALOXONE HCL 2 MG/2ML IJ SOSY
2.0000 mg | PREFILLED_SYRINGE | Freq: Once | INTRAMUSCULAR | Status: AC
  Administered 2022-01-10: 2 mg via INTRAVENOUS
  Filled 2022-01-10: qty 2

## 2022-01-10 NOTE — ED Notes (Signed)
Pt is more alert at this time. Pt is A&Ox 4. Pt walked to the bathroom with stand-by assist.

## 2022-01-10 NOTE — ED Notes (Signed)
Patient transported to CT 

## 2022-01-10 NOTE — ED Provider Notes (Signed)
[facility] Provider Note    Event Date/Time   First MD Initiated Contact with Patient 01/10/22 2121     (approximate)   History   Drug Overdose, Head Injury, and Knee Pain   HPI  Saahir Prude is a 26 y.o. male who reportedly took a shot of tequila a muscle relaxer and Percocet for back pain.  Medicines he received from a friend of his.  He passed out and fell backwards reportedly.  His family given 2 mg of Narcan intranasally EMS gave him 1/2 mg of Narcan IV patient arrived he was somewhat drowsy but with talking and was oriented and doing well otherwise.      Physical Exam   Triage Vital Signs: ED Triage Vitals  Enc Vitals Group     BP 01/10/22 2117 (!) 155/104     Pulse Rate 01/10/22 2117 (!) 101     Resp 01/10/22 2117 18     Temp 01/10/22 2117 98.4 F (36.9 C)     Temp Source 01/10/22 2117 Oral     SpO2 01/10/22 2117 94 %     Weight 01/10/22 2121 260 lb (117.9 kg)     Height --      Head Circumference --      Peak Flow --      Pain Score 01/10/22 2119 9     Pain Loc --      Pain Edu? --      Excl. in GC? --     Most recent vital signs: Vitals:   01/10/22 2117 01/10/22 2248  BP: (!) 155/104 (!) 148/105  Pulse: (!) 101 (!) 104  Resp: 18 14  Temp: 98.4 F (36.9 C)   SpO2: 94% 98%     General: Sleepy but not sleeping.  Easily arousable no distress.  CV:  Good peripheral perfusion.  Heart regular rate and rhythm no audible murmurs Resp:  Normal effort.  Lungs are clear Chest nontender Abd:  No distention.  Nontender Extremities no edema good range of motion abdomen not tender   ED Results / Procedures / Treatments   Labs (all labs ordered are listed, but only abnormal results are displayed) Labs Reviewed  ETHANOL - Abnormal; Notable for the following components:      Result Value   Alcohol, Ethyl (B) 86 (*)    All other components within normal limits  COMPREHENSIVE METABOLIC PANEL - Abnormal; Notable for the following  components:   Potassium 3.4 (*)    Creatinine, Ser 1.51 (*)    All other components within normal limits  CBC WITH DIFFERENTIAL/PLATELET - Abnormal; Notable for the following components:   MCH 25.2 (*)    All other components within normal limits  URINE DRUG SCREEN, QUALITATIVE ([facility] ONLY)  CK  CBG MONITORING, ED  TROPONIN I (HIGH SENSITIVITY)     EKG EKG read interpreted by me shows sinus tachycardia rate 104 normal axis apparent biphasic T wave in V3 only.  Flipped T in lead III as well.  Otherwise no acute changes.    RADIOLOGY    PROCEDURES:  Critical Care performed:   Procedures   MEDICATIONS ORDERED IN ED: Medications  naloxone HCl (NARCAN) 4 mg in dextrose 5 % 250 mL infusion (0 mg/hr Intravenous Hold 01/10/22 2309)  sodium chloride 0.9 % bolus 1,000 mL (has no administration in time range)  potassium chloride SA (KLOR-CON M) CR tablet 40 mEq (has no administration in time range)  naloxone (NARCAN) injection 2 mg (  2 mg Intravenous Given 01/10/22 2149)     IMPRESSION / MDM / ASSESSMENT AND PLAN / ED COURSE  I reviewed the triage vital signs and the nursing notes. Patient gets sleepy or we give him the whole amp of Narcan 1 mg at a time he really does not wake up much more.  We will get a head CT and a neck CT check his CBG and get lab work now.  I will put him on a Narcan drip as well.   The patient is on the cardiac monitor to evaluate for evidence of arrhythmia and/or significant heart rate changes.  None have been seen   ----------------------------------------- 11:15 PM on 01/10/2022 ----------------------------------------- Patient still somewhat sleepy but being kept awake by his family.  We have not started Narcan drip.  He is here we will start it if he becomes more sleepy.  We will have to watch the patient for couple hours yet and I have signed him out to the oncoming physician.   FINAL CLINICAL IMPRESSION(S) / ED DIAGNOSES   Final diagnoses:   Accidental drug overdose, initial encounter     Rx / DC Orders   ED Discharge Orders     None        Note:  This document was prepared using Dragon voice recognition software and may include unintentional dictation errors.   Arnaldo Natal, MD 01/10/22 [phone]

## 2022-01-10 NOTE — ED Triage Notes (Signed)
Pt complains of left knee pain.

## 2022-01-10 NOTE — ED Triage Notes (Signed)
Pt states he took a shot of tequila, a muscle relaxer and a percocet. Pt passed out from a standing position striking the back of his head. Family administered 2mg  of Narcan and EMS gave a half MG IV of narcan prior to arrival. Pt is drowsy but alert to voice.

## 2022-01-11 ENCOUNTER — Observation Stay: Payer: Self-pay

## 2022-01-11 ENCOUNTER — Encounter: Payer: Self-pay | Admitting: Internal Medicine

## 2022-01-11 ENCOUNTER — Observation Stay: Admit: 2022-01-11 | Payer: Self-pay

## 2022-01-11 DIAGNOSIS — G9340 Encephalopathy, unspecified: Secondary | ICD-10-CM

## 2022-01-11 DIAGNOSIS — R7989 Other specified abnormal findings of blood chemistry: Secondary | ICD-10-CM | POA: Diagnosis present

## 2022-01-11 DIAGNOSIS — E876 Hypokalemia: Secondary | ICD-10-CM | POA: Diagnosis present

## 2022-01-11 DIAGNOSIS — T50901A Poisoning by unspecified drugs, medicaments and biological substances, accidental (unintentional), initial encounter: Secondary | ICD-10-CM

## 2022-01-11 DIAGNOSIS — N179 Acute kidney failure, unspecified: Secondary | ICD-10-CM

## 2022-01-11 DIAGNOSIS — F10920 Alcohol use, unspecified with intoxication, uncomplicated: Secondary | ICD-10-CM

## 2022-01-11 DIAGNOSIS — R778 Other specified abnormalities of plasma proteins: Secondary | ICD-10-CM

## 2022-01-11 DIAGNOSIS — F10929 Alcohol use, unspecified with intoxication, unspecified: Secondary | ICD-10-CM | POA: Diagnosis present

## 2022-01-11 LAB — COMPREHENSIVE METABOLIC PANEL
ALT: 27 U/L (ref 0–44)
AST: 25 U/L (ref 15–41)
Albumin: 3.7 g/dL (ref 3.5–5.0)
Alkaline Phosphatase: 80 U/L (ref 38–126)
Anion gap: 7 (ref 5–15)
BUN: 12 mg/dL (ref 6–20)
CO2: 25 mmol/L (ref 22–32)
Calcium: 8.5 mg/dL — ABNORMAL LOW (ref 8.9–10.3)
Chloride: 103 mmol/L (ref 98–111)
Creatinine, Ser: 1.27 mg/dL — ABNORMAL HIGH (ref 0.61–1.24)
GFR, Estimated: >60 mL/min (ref >60)
Glucose, Bld: 93 mg/dL (ref 70–99)
Potassium: 3.9 mmol/L (ref 3.5–5.1)
Sodium: 135 mmol/L (ref 135–145)
Total Bilirubin: 0.5 mg/dL (ref 0.3–1.2)
Total Protein: 6.6 g/dL (ref 6.5–8.1)

## 2022-01-11 LAB — CBC WITH DIFFERENTIAL/PLATELET
Abs Immature Granulocytes: 0.01 10*3/uL (ref 0.00–0.07)
Basophils Absolute: 0 10*3/uL (ref 0.0–0.1)
Basophils Relative: 0 %
Eosinophils Absolute: 0.3 10*3/uL (ref 0.0–0.5)
Eosinophils Relative: 3 %
HCT: 39.8 % (ref 39.0–52.0)
Hemoglobin: 12.5 g/dL — ABNORMAL LOW (ref 13.0–17.0)
Immature Granulocytes: 0 %
Lymphocytes Relative: 25 %
Lymphs Abs: 2 10*3/uL (ref 0.7–4.0)
MCH: 25.3 pg — ABNORMAL LOW (ref 26.0–34.0)
MCHC: 31.4 g/dL (ref 30.0–36.0)
MCV: 80.4 fL (ref 80.0–100.0)
Monocytes Absolute: 0.7 10*3/uL (ref 0.1–1.0)
Monocytes Relative: 9 %
Neutro Abs: 5.1 10*3/uL (ref 1.7–7.7)
Neutrophils Relative %: 63 %
Platelets: 264 10*3/uL (ref 150–400)
RBC: 4.95 MIL/uL (ref 4.22–5.81)
RDW: 14.4 % (ref 11.5–15.5)
WBC: 8.2 10*3/uL (ref 4.0–10.5)
nRBC: 0 % (ref 0.0–0.2)

## 2022-01-11 LAB — URINALYSIS, COMPLETE (UACMP) WITH MICROSCOPIC
Bacteria, UA: NONE SEEN
Bilirubin Urine: NEGATIVE
Glucose, UA: NEGATIVE mg/dL
Hgb urine dipstick: NEGATIVE
Ketones, ur: NEGATIVE mg/dL
Nitrite: NEGATIVE
Protein, ur: 30 mg/dL — AB
Specific Gravity, Urine: 1.016 (ref 1.005–1.030)
pH: 6 (ref 5.0–8.0)

## 2022-01-11 LAB — TROPONIN I (HIGH SENSITIVITY)
Troponin I (High Sensitivity): 42 ng/L — ABNORMAL HIGH (ref <18)
Troponin I (High Sensitivity): 39 ng/L — ABNORMAL HIGH (ref <18)
Troponin I (High Sensitivity): 32 ng/L — ABNORMAL HIGH (ref <18)

## 2022-01-11 LAB — BLOOD GAS, VENOUS
Acid-base deficit: 0.8 mmol/L (ref 0.0–2.0)
Bicarbonate: 25.4 mmol/L (ref 20.0–28.0)
O2 Saturation: 74 %
Patient temperature: 37
pCO2, Ven: 47 mmHg (ref 44–60)
pH, Ven: 7.34 (ref 7.25–7.43)
pO2, Ven: 43 mmHg (ref 32–45)

## 2022-01-11 LAB — SODIUM, URINE, RANDOM: Sodium, Ur: 101 mmol/L

## 2022-01-11 LAB — CREATININE, URINE, RANDOM: Creatinine, Urine: 166 mg/dL

## 2022-01-11 LAB — MAGNESIUM
Magnesium: 1.9 mg/dL (ref 1.7–2.4)
Magnesium: 1.9 mg/dL (ref 1.7–2.4)

## 2022-01-11 LAB — RESP PANEL BY RT-PCR (FLU A&B, COVID) ARPGX2
Influenza A by PCR: NEGATIVE
Influenza B by PCR: NEGATIVE
SARS Coronavirus 2 by RT PCR: NEGATIVE

## 2022-01-11 LAB — HIV ANTIBODY (ROUTINE TESTING W REFLEX): HIV Screen 4th Generation wRfx: NONREACTIVE

## 2022-01-11 LAB — TSH: TSH: 1.646 u[IU]/mL (ref 0.350–4.500)

## 2022-01-11 MED ORDER — ACETAMINOPHEN 325 MG PO TABS: 650.0000 mg | ORAL_TABLET | Freq: Four times a day (QID) | ORAL | Status: DC | PRN

## 2022-01-11 MED ORDER — NALOXONE HCL 2 MG/2ML IJ SOSY: 1.0000 mg | PREFILLED_SYRINGE | INTRAMUSCULAR | Status: DC | PRN

## 2022-01-11 MED ORDER — M.V.I. ADULT IV INJ
INJECTION | Freq: Once | INTRAVENOUS | Status: AC
  Filled 2022-01-11: qty 1000

## 2022-01-11 MED ORDER — ACETAMINOPHEN 325 MG RE SUPP: 650.0000 mg | Freq: Four times a day (QID) | RECTAL | Status: DC | PRN

## 2022-01-11 MED ORDER — SODIUM CHLORIDE 0.9 % IV SOLN
INTRAVENOUS | Status: DC
Start: 2022-01-11 — End: 2022-01-11

## 2022-01-11 MED ORDER — ASPIRIN 81 MG PO CHEW
324.0000 mg | CHEWABLE_TABLET | Freq: Once | ORAL | Status: AC
  Administered 2022-01-11: 324 mg via ORAL
  Filled 2022-01-11: qty 4

## 2022-01-11 NOTE — H&P (Signed)
History and Physical    PLEASE NOTE THAT DRAGON DICTATION SOFTWARE WAS USED IN THE CONSTRUCTION OF THIS NOTE.   Greg Benjamin [id number] DOB: 11-Jun-1996 DOA: 01/10/2022  PCP: Pcp, No (will further assess) Patient coming from: home   I have personally briefly reviewed patient's old medical [facility]  Chief Complaint: Altered mental status  HPI: Greg Benjamin is a 26 y.o. male with without any reported significant past medical history who is admitted to [facility] on 01/10/2022 with acute encephalopathy after presenting from home [facility] ED for evaluation of altered mental status.   Patient reports that on 01/10/2022, that he took an analgesic pill of unclear etiology as well as a muscle relaxant, both given to him by a friend, for recreational purposes.  He also consumed some alcohol at the time of taking these 2 medications.  Subsequently, he was found at home by his family, with the patient noted to be exhibiting diminished responsiveness at that time.  There is concern by family the patient may not be breathing, prompting them to start chest compressions EMS was contacted, and administered Narcan upon arrival, prompting improvement in the patient's responsiveness.  He is without acute complaint at this time.   Per chart review, most recent prior serum creatinine data point noted to be 0.98 in April 2019.     ED Course:  Vital signs in the ED were notable for the following: Afebrile; heart rate initially 104, which subsequently proved into the days following interval administration of IV fluids, as further quantified below; pressure 138/91; respiratory rate 15-18, oxygen saturation 97 to 98% on room air.  Labs were notable for the following: CMP notable for the following: Sodium 138, potassium 3.4, bicarb 25, creatinine 1.51, glucose 79, and liver enzymes were found to be within normal limits.  CBC notable for white cell count 10,100.  Total CPK 280, initial high-sensitivity  troponin I 12, with repeat value trending up slightly to 42.  Serum ethanol level 86.  Urinary drug screen was positive for cannabinoids as well as cocaine metabolite, but otherwise pan negative.  COVID-19/influenza PCR pending.  Imaging and additional notable ED work-up: EKG shows sinus tachycardia with heart rate 104, normal intervals, no evidence of T wave or ST changes.  Noncontrast CT head shows no evidence of acute intracranial process, including no evidence of intracranial hemorrhage.  CT cervical spine shows no evidence of acute cervical fracture.  While in the ED, the following were administered: Full dose aspirin p.o. x1; Narcan 2 mg IV x1, potassium chloride 40 mg p.o. x1, and a 1 L normal saline bolus.  Order was originally placed by EDP for initiation of Narcan drip, however, the patient has woken up sufficiently such that this drip has not been initiated.  Subsequently, the patient was admitted for overnight observation for further evaluation management of acute encephalopathy in the setting of apparent unintentional overdose as well as acute alcohol intoxication, with presentation also notable for acute kidney injury mildly elevated troponin, and hypokalemia.     Review of Systems: As per HPI otherwise 10 point review of systems negative.   History reviewed. No pertinent past medical history.  History reviewed. No pertinent surgical history.  Social History:  reports that he has been smoking cigarettes. He has been smoking an average of .5 packs per day. He has never used smokeless tobacco. He reports current alcohol use. He reports current drug use. Drug: Marijuana.   Allergies  Allergen Reactions   Bee  Venom    Gramineae Pollens     History reviewed. No pertinent family history.    Outpatient medications: Denies any use of the counter or prescription medications or supplements as an outpatient.    Objective    Physical Exam: Vitals:   01/10/22 2121 01/10/22  2248 01/11/22 0027 01/11/22 0100  BP:  (!) 148/105 (!) 138/91 (!) 135/98  Pulse:  (!) 104 83 79  Resp:  14 15 18   Temp:      TempSrc:      SpO2:  98% 97% 97%  Weight: 117.9 kg       General: appears to be stated age; somnolent, but will open eyes to verbal stimuli, answer questions posed to him with multiword responses, before falling back asleep. Skin: warm, dry, no rash Head:  AT/[place] Mouth:  Oral mucosa membranes appear moist, normal dentition Neck: supple; trachea midline Heart:  RRR; did not appreciate any M/R/G Lungs: CTAB, did not appreciate any wheezes, rales, or rhonchi Abdomen: + BS; soft, ND, NT Vascular: 2+ pedal pulses b/l; 2+ radial pulses b/l Extremities: no peripheral edema, no muscle wasting Neuro: strength and sensation intact in upper and lower extremities b/l      Labs on Admission: I have personally reviewed following labs and imaging studies  CBC: Recent Labs  Lab 01/10/22 2158  WBC 10.1  NEUTROABS 7.3  HGB 14.0  HCT 45.4  MCV 81.7  PLT 275   Basic Metabolic Panel: Recent Labs  Lab 01/10/22 2158  NA 138  K 3.4*  CL 101  CO2 25  GLUCOSE 79  BUN 14  CREATININE 1.51*  CALCIUM 9.1   GFR: CrCl cannot be calculated (Unknown ideal weight.). Liver Function Tests: Recent Labs  Lab 01/10/22 2158  AST 29  ALT 30  ALKPHOS 85  BILITOT 0.6  PROT 7.7  ALBUMIN 4.3   No results for input(s): LIPASE, AMYLASE in the last 168 hours. No results for input(s): AMMONIA in the last 168 hours. Coagulation Profile: No results for input(s): INR, PROTIME in the last 168 hours. Cardiac Enzymes: Recent Labs  Lab 01/10/22 2158  CKTOTAL 280   BNP (last 3 results) No results for input(s): PROBNP in the last 8760 hours. HbA1C: No results for input(s): HGBA1C in the last 72 hours. CBG: Recent Labs  Lab 01/10/22 2256  GLUCAP 86   Lipid Profile: No results for input(s): CHOL, HDL, LDLCALC, TRIG, CHOLHDL, LDLDIRECT in the last 72 hours. Thyroid  Function Tests: No results for input(s): TSH, T4TOTAL, FREET4, T3FREE, THYROIDAB in the last 72 hours. Anemia Panel: No results for input(s): VITAMINB12, FOLATE, FERRITIN, TIBC, IRON, RETICCTPCT in the last 72 hours. Urine analysis: No results found for: COLORURINE, APPEARANCEUR, LABSPEC, PHURINE, GLUCOSEU, HGBUR, BILIRUBINUR, KETONESUR, PROTEINUR, UROBILINOGEN, NITRITE, LEUKOCYTESUR  Radiological Exams on Admission: CT Head Wo Contrast  Result Date: 01/10/2022 CLINICAL DATA:  Head and neck trauma. EXAM: CT HEAD WITHOUT CONTRAST CT CERVICAL SPINE WITHOUT CONTRAST TECHNIQUE: Multidetector CT imaging of the head and cervical spine was performed following the standard protocol without intravenous contrast. Multiplanar CT image reconstructions of the cervical spine were also generated. RADIATION DOSE REDUCTION: This exam was performed according to the departmental dose-optimization program which includes automated exposure control, adjustment of the mA and/or kV according to patient size and/or use of iterative reconstruction technique. COMPARISON:  Head CT and cervical spine CT both 02/25/2018 FINDINGS: CT HEAD FINDINGS Brain: No evidence of acute infarction, hemorrhage, hydrocephalus, extra-axial collection or mass lesion/mass effect. Vascular: No hyperdense  vessel or unexpected calcification. Skull: The calvarium, skull base and orbits are intact. No skull lesion is seen. There is no visible scalp hematoma. Sinuses/Orbits: There is increased mild-to-moderate membrane thickening throughout the paranasal sinuses compared to the prior study. There are no fluid levels. There is no mastoid effusion. Right-sided nasal septal deviation and spurring are again shown. Orbital contents unremarkable. Other: None. CT CERVICAL SPINE FINDINGS Alignment: There is again noted slight cervical kyphodextroscoliosis without evidence of listhesis, unchanged. Skull base and vertebrae: No fracture or focal bone lesion is seen.  Soft tissues and spinal canal: No prevertebral fluid or swelling. No visible canal hematoma. Please note however, the spinal canal is not well seen below the level of C4 due to beam hardening artifact from the patient's superimposed shoulders. The palatine tonsils are both enlarged, but this was also seen previously. The adenoidal eminence is also enlarged. Disc levels: There is preservation of the normal disc and vertebral body heights. Trace endplate spurring is beginning to develop at C5-6 and C6-7 without appreciable significant canal encroachment. Cervical foramina appear patent. Arthritic changes are not seen. Upper chest: Negative. Other: None. IMPRESSION: 1. No acute intracranial CT findings or depressed skull fractures. 2. 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 3. Slight cervical kyphodextroscoliosis without evidence of fractures or listhesis but with limited image detail below C4. 4. Enlarged adenoids and palatine tonsils, also seen previously. Electronically Signed   By: Almira Bar M.D.   On: 01/10/2022 22:35   CT Cervical Spine Wo Contrast  Result Date: 01/10/2022 CLINICAL DATA:  Head and neck trauma. EXAM: CT HEAD WITHOUT CONTRAST CT CERVICAL SPINE WITHOUT CONTRAST TECHNIQUE: Multidetector CT imaging of the head and cervical spine was performed following the standard protocol without intravenous contrast. Multiplanar CT image reconstructions of the cervical spine were also generated. RADIATION DOSE REDUCTION: This exam was performed according to the departmental dose-optimization program which includes automated exposure control, adjustment of the mA and/or kV according to patient size and/or use of iterative reconstruction technique. COMPARISON:  Head CT and cervical spine CT both 02/25/2018 FINDINGS: CT HEAD FINDINGS Brain: No evidence of acute infarction, hemorrhage, hydrocephalus, extra-axial collection or mass lesion/mass effect. Vascular: No hyperdense vessel or  unexpected calcification. Skull: The calvarium, skull base and orbits are intact. No skull lesion is seen. There is no visible scalp hematoma. Sinuses/Orbits: There is increased mild-to-moderate membrane thickening throughout the paranasal sinuses compared to the prior study. There are no fluid levels. There is no mastoid effusion. Right-sided nasal septal deviation and spurring are again shown. Orbital contents unremarkable. Other: None. CT CERVICAL SPINE FINDINGS Alignment: There is again noted slight cervical kyphodextroscoliosis without evidence of listhesis, unchanged. Skull base and vertebrae: No fracture or focal bone lesion is seen. Soft tissues and spinal canal: No prevertebral fluid or swelling. No visible canal hematoma. Please note however, the spinal canal is not well seen below the level of C4 due to beam hardening artifact from the patient's superimposed shoulders. The palatine tonsils are both enlarged, but this was also seen previously. The adenoidal eminence is also enlarged. Disc levels: There is preservation of the normal disc and vertebral body heights. Trace endplate spurring is beginning to develop at C5-6 and C6-7 without appreciable significant canal encroachment. Cervical foramina appear patent. Arthritic changes are not seen. Upper chest: Negative. Other: None. IMPRESSION: 1. No acute intracranial CT findings or depressed skull fractures. 2. 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 3. Slight cervical kyphodextroscoliosis without evidence of  fractures or listhesis but with limited image detail below C4. 4. Enlarged adenoids and palatine tonsils, also seen previously. Electronically Signed   By: Almira Bar M.D.   On: 01/10/2022 22:35     EKG: Independently reviewed, with result as described above.    Assessment/Plan    Principal Problem:   Acute encephalopathy Active Problems:   Accidental overdose   Acute alcohol intoxication (HCC)   AKI (acute kidney  injury) (HCC)   Hypokalemia   Elevated troponin      #) Acute encephalopathy: Presenting somnolence with suspected significant contribution from unintentional overdose on reported outpatient analgesic pill as well as muscle relaxant, in addition to contribution from acute alcohol intoxication, with presenting serum ethanol level 86.  Potential additional recreational drug contributions stemming from UDS that was positive for cannabinoids as well as cocaine metabolite.  Acute stroke is felt to be less likely, including CT head which showed no evidence of acute intracranial process, including no evidence of intracranial hemorrhage.  Additionally, seizures also appear less likely at this time.  No overt evidence of underlying infectious process at this time, although COVID-19/influenza PCR result remains pending.  We will also check chest x-ray and urinalysis to further evaluate these possibilities.  No overt additional metabolic contributing factors identified at this time, but will further expand laboratory evaluation for such, as defined below.  Of note, patient appears hemodynamically stable, appears to be protecting his airway, no evidence of respiratory distress, and is maintaining oxygen saturations in the high 90s on room air.  Plan: Prn Narcan.  Continuous IV fluids.  Monitor continuous pulse oximetry.  Follow-up result COVID-19/influenza PCR.  Check chest x-ray as well as urinalysis, as above.  Check MMA, TSH, VBG.  Repeat CMP and CBC in the morning.      #) Unintentional overdose: As further detailed above, with patient reporting that he took unspecified analgesic pill as well as muscle relaxant, will UDS notable for cannabinoids as well as cocaine metabolite.  Appears that this ingestion occurred for recreational purposes, without any evidence of suicidal ideations.  Patient appears to be protecting his airway, mentating O2 sats in the high 90s on room air, with gradually improving  somnolence, as further detailed above.   Plan: Monitor continuous pulse oximetry.  Prn Narcan, as above.  Repeat CMP in the morning.      #) Acute alcohol intoxication: Status postingestion of alcohol with the above pills earlier in the day, with serum ethanol level at presentation noted to be 86.  Likely contributing to degree of the patient's presenting somnolence.  No clinical evidence to suggest seizures.  Plan: Banana bag x1.  Add on serum magnesium level.  Repeat CMP in the morning.      #) Acute kidney injury: Presenting serum creatinine 1.51 relative to most recent prior value of 0.98 in April 2019.  Of note, total CPK not elevated at 280.  Plan: Continue IV fluids initiated in the ED.  Check urinalysis with microscopy as well as random urine sodium and random urine creatinine.  Repeat BMP in the morning.       #) Elevated troponin: Initial troponin 12, followed by repeat value trending up slightly to 42, with suspicion that this is on the basis of report of chest compressions administered by patient's family prior to EMS arrival, as further detailed above.  Less likely to be ACS including EKG which shows no evidence of acute ischemic changes.  There may also be a contribution from diminished  renal clearance in the setting of presenting acute kidney injury, as further detailed above.  Aspirin 324 mg p.o. x1 administered in the ED today.  Plan: Continue to trend serial troponin.  Echocardiogram ordered for the morning.  Monitor on telemetry.  Repeat CMP in the morning.  Check chest x-ray.       #) Hypokalemia: Presenting serum potassium level 3.4.  Status post potassium chloride 40 mill equivalents p.o. x1 dose in the ED this evening.  Plan: Add on serum magnesium level.  Monitor on symmetry.  Repeat CMP in the morning.      DVT prophylaxis: SCD's   Code Status: Full code Family Communication: none Disposition Plan: Per Rounding Team Consults called: none;   Admission status: Observation   PLEASE NOTE THAT DRAGON DICTATION SOFTWARE WAS USED IN THE CONSTRUCTION OF THIS NOTE.   Chaney BornJustin B Davidjames Blansett DO [facility] From 7PM - 7AM   01/11/2022, 3:29 AM

## 2022-01-11 NOTE — ED Provider Notes (Signed)
2:05 AM Assumed care at shift change.  Patient here after an unintentional overdose on opiates requiring Narcan.  Was unresponsive with family and CPR was initiated.  He is required multiple rounds of Narcan but is not on a Narcan infusion.  On my evaluation, patient is still very somnolent but arousable to voice but will quickly fall back asleep and begins snoring.  He is not requiring oxygen but has been monitored here here for 5 hours and is still very sleepy.  I have recommended that he be observed in the hospital and his family member agrees.  I have reviewed his lab work and it looks like he has a new AKI and is getting IV fluids.  His alcohol level was 86.  Normal hemoglobin, CK, electrolytes.  His troponins have been elevated which is likely due to getting CPR.  He denies any chest pain or shortness of breath.  We will continue to cycle his troponins and keep him on cardiac monitoring.  His EKG is nonischemic.  I have ordered a dose of aspirin.  He did have imaging as well of his chest, head and cervical spine and I have reviewed these images as well as the radiology report and there is no acute abnormality noted.  I feel he should be admitted to the hospital.  Will discuss with the hospitalist.  Consulted and discussed patient's case with hospitalist, Dr. Arlean Hopping.  I have recommended admission and consulting physician agrees and will place admission orders.  Patient (and family if present) agree with this plan.   I reviewed all nursing notes, vitals, pertinent previous records.  All labs, EKGs, imaging ordered have been independently reviewed and interpreted by myself.    Haley Fuerstenberg, Layla Maw, DO 01/11/22 [phone]

## 2022-01-11 NOTE — Discharge Summary (Signed)
Physician Discharge Summary  Greg Benjamin ZOX:[id number] DOB: [birth year] DOA: 01/10/2022  PCP: Oneita Hurt, No  Admit date: 01/10/2022 Discharge date: 01/11/2022  Admitted From: Home Disposition:  Home  Discharge Condition:Stable CODE STATUS:FULL Diet recommendation:  Regular   Brief/Interim Summary:  Greg Benjamin is a 26 y.o. male with without any reported significant past medical history who is admitted to [facility] on 01/10/2022 with acute encephalopathy after presenting from home to [facility] ED for evaluation of altered mental status.   Patient reports that on 01/10/2022, that he took an analgesic pill of unclear etiology as well as a muscle relaxant, both given to him by a friend, for recreational purposes.  He also consumed some alcohol at the time of taking these 2 medications.  Subsequently, he was found at home by his family, with the patient noted to be exhibiting diminished responsiveness at that time.  There is concern by family the patient may not be breathing, prompting them to start chest compressions EMS was contacted, and administered Narcan upon arrival, prompting improvement in the patient's responsiveness.  Patient 's mental status gradually improved and currently he is alert and oriented.  UDS showed cocaine, cannabis.  This morning he was alert and oriented, hemodynamically stable.  He is medically stable for discharge.  He has been counseled to quit drugs and alcohol.  Following problems were addressed during his hospitalization:  Acute encephalopathy: Metabolic encephalopathy secondary to overdose, polypharmacy, alcohol use.  Currently alert and oriented.  CT head did not show any acute intracranial process.  Polypharmacy/drug use: UDS positive for cocaine, cannabis.  Counseled for cessation  AKI: Improving with IV fluids.  Encourage fluid intake  Elevated troponin: No chest pain.  As per the report, he was given CPR because he was found to be unresponsive.  Low suspicion for ACS.  No  further work-up done  Hypokalemia: Supplemented with potassium.  Potassium level this morning is normal  Obesity: As per his partner, he snores heavily during sleep.  We recommend to do a sleep study as an outpatient to diagnose sleep apnea  Discharge Diagnoses:  Principal Problem:   Acute encephalopathy Active Problems:   Accidental overdose   Acute alcohol intoxication (HCC)   AKI (acute kidney injury) (HCC)   Hypokalemia   Elevated troponin    Discharge Instructions  Discharge Instructions     Diet general   Complete by: As directed    Discharge instructions   Complete by: As directed    1)Do a sleep study to diagnose sleep apnea 2)Please limit alcohol intake,quit substance abuse   Increase activity slowly   Complete by: As directed       Allergies as of 01/11/2022       Reactions   Bee Venom    Gramineae Pollens         Medication List    You have not been prescribed any medications.     Allergies  Allergen Reactions   Bee Venom    Gramineae Pollens     Consultations: None   Procedures/Studies: CT Head Wo Contrast  Result Date: 01/10/2022 CLINICAL DATA:  Head and neck trauma. EXAM: CT HEAD WITHOUT CONTRAST CT CERVICAL SPINE WITHOUT CONTRAST TECHNIQUE: Multidetector CT imaging of the head and cervical spine was performed following the standard protocol without intravenous contrast. Multiplanar CT image reconstructions of the cervical spine were also generated. RADIATION DOSE REDUCTION: This exam was performed according to the departmental dose-optimization program which includes automated exposure control, adjustment of the mA and/or kV  according to patient size and/or use of iterative reconstruction technique. COMPARISON:  Head CT and cervical spine CT both 02/25/2018 FINDINGS: CT HEAD FINDINGS Brain: No evidence of acute infarction, hemorrhage, hydrocephalus, extra-axial collection or mass lesion/mass effect. Vascular: No hyperdense vessel or  unexpected calcification. Skull: The calvarium, skull base and orbits are intact. No skull lesion is seen. There is no visible scalp hematoma. Sinuses/Orbits: There is increased mild-to-moderate membrane thickening throughout the paranasal sinuses compared to the prior study. There are no fluid levels. There is no mastoid effusion. Right-sided nasal septal deviation and spurring are again shown. Orbital contents unremarkable. Other: None. CT CERVICAL SPINE FINDINGS Alignment: There is again noted slight cervical kyphodextroscoliosis without evidence of listhesis, unchanged. Skull base and vertebrae: No fracture or focal bone lesion is seen. Soft tissues and spinal canal: No prevertebral fluid or swelling. No visible canal hematoma. Please note however, the spinal canal is not well seen below the level of C4 due to beam hardening artifact from the patient's superimposed shoulders. The palatine tonsils are both enlarged, but this was also seen previously. The adenoidal eminence is also enlarged. Disc levels: There is preservation of the normal disc and vertebral body heights. Trace endplate spurring is beginning to develop at C5-6 and C6-7 without appreciable significant canal encroachment. Cervical foramina appear patent. Arthritic changes are not seen. Upper chest: Negative. Other: None. IMPRESSION: 1. No acute intracranial CT findings or depressed skull fractures. 2. 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 3. Slight cervical kyphodextroscoliosis without evidence of fractures or listhesis but with limited image detail below C4. 4. Enlarged adenoids and palatine tonsils, also seen previously. Electronically Signed   By: Almira BarKeith  Chesser M.D.   On: 01/10/2022 22:35   CT Cervical Spine Wo Contrast  Result Date: 01/10/2022 CLINICAL DATA:  Head and neck trauma. EXAM: CT HEAD WITHOUT CONTRAST CT CERVICAL SPINE WITHOUT CONTRAST TECHNIQUE: Multidetector CT imaging of the head and cervical spine was  performed following the standard protocol without intravenous contrast. Multiplanar CT image reconstructions of the cervical spine were also generated. RADIATION DOSE REDUCTION: This exam was performed according to the departmental dose-optimization program which includes automated exposure control, adjustment of the mA and/or kV according to patient size and/or use of iterative reconstruction technique. COMPARISON:  Head CT and cervical spine CT both 02/25/2018 FINDINGS: CT HEAD FINDINGS Brain: No evidence of acute infarction, hemorrhage, hydrocephalus, extra-axial collection or mass lesion/mass effect. Vascular: No hyperdense vessel or unexpected calcification. Skull: The calvarium, skull base and orbits are intact. No skull lesion is seen. There is no visible scalp hematoma. Sinuses/Orbits: There is increased mild-to-moderate membrane thickening throughout the paranasal sinuses compared to the prior study. There are no fluid levels. There is no mastoid effusion. Right-sided nasal septal deviation and spurring are again shown. Orbital contents unremarkable. Other: None. CT CERVICAL SPINE FINDINGS Alignment: There is again noted slight cervical kyphodextroscoliosis without evidence of listhesis, unchanged. Skull base and vertebrae: No fracture or focal bone lesion is seen. Soft tissues and spinal canal: No prevertebral fluid or swelling. No visible canal hematoma. Please note however, the spinal canal is not well seen below the level of C4 due to beam hardening artifact from the patient's superimposed shoulders. The palatine tonsils are both enlarged, but this was also seen previously. The adenoidal eminence is also enlarged. Disc levels: There is preservation of the normal disc and vertebral body heights. Trace endplate spurring is beginning to develop at C5-6 and C6-7 without appreciable significant canal encroachment. Cervical  foramina appear patent. Arthritic changes are not seen. Upper chest: Negative. Other:  None. IMPRESSION: 1. No acute intracranial CT findings or depressed skull fractures. 2. 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 3. Slight cervical kyphodextroscoliosis without evidence of fractures or listhesis but with limited image detail below C4. 4. Enlarged adenoids and palatine tonsils, also seen previously. Electronically Signed   By: Almira BarKeith  Chesser M.D.   On: 01/10/2022 22:35   DG Chest Port 1 View  Result Date: 01/11/2022 CLINICAL DATA:  Syncope EXAM: PORTABLE CHEST 1 VIEW COMPARISON:  07/12/2018 FINDINGS: Low lung volumes. Heart and mediastinal contours are within normal limits. No focal opacities or effusions. No acute bony abnormality. IMPRESSION: No active disease. Electronically Signed   By: Charlett NoseKevin  Dover M.D.   On: 01/11/2022 03:47      Subjective: Patient seen and examined at bedside this morning.  Hemodynamically stable for discharge today.  Discharge Exam: Vitals:   01/11/22 0400 01/11/22 0610  BP: 140/84 (!) 139/101  Pulse: 65 73  Resp: (!) 27 13  Temp:    SpO2: 95% 100%   Vitals:   01/11/22 0027 01/11/22 0100 01/11/22 0400 01/11/22 0610  BP: (!) 138/91 (!) 135/98 140/84 (!) 139/101  Pulse: 83 79 65 73  Resp: 15 18 (!) 27 13  Temp:      TempSrc:      SpO2: 97% 97% 95% 100%  Weight:        General: Pt is alert, awake, not in acute distress,obese Cardiovascular: RRR, S1/S2 +, no rubs, no gallops Respiratory: CTA bilaterally, no wheezing, no rhonchi Abdominal: Soft, NT, ND, bowel sounds + Extremities: no edema, no cyanosis    The results of significant diagnostics from this hospitalization (including imaging, microbiology, ancillary and laboratory) are listed below for reference.     Microbiology: Recent Results (from the past 240 hour(s))  Resp Panel by RT-PCR (Flu A&B, Covid) Nasopharyngeal Swab     Status: None   Collection Time: 01/11/22  1:48 AM   Specimen: Nasopharyngeal Swab; Nasopharyngeal(NP) swabs in vial transport medium   Result Value Ref Range Status   SARS Coronavirus 2 by RT PCR NEGATIVE NEGATIVE Final    Comment: (NOTE) SARS-CoV-2 target nucleic acids are NOT DETECTED.  The SARS-CoV-2 RNA is generally detectable in upper respiratory specimens during the acute phase of infection. The lowest concentration of SARS-CoV-2 viral copies this assay can detect is 138 copies/mL. A negative result does not preclude SARS-Cov-2 infection and should not be used as the sole basis for treatment or other patient management decisions. A negative result may occur with  improper specimen collection/handling, submission of specimen other than nasopharyngeal swab, presence of viral mutation(s) within the areas targeted by this assay, and inadequate number of viral copies(<138 copies/mL). A negative result must be combined with clinical observations, patient history, and epidemiological information. The expected result is Negative.  Fact Sheet for Patients:  BloggerCourse.comhttps://www.fda.gov/media/152166/download  Fact Sheet for Healthcare Providers:  SeriousBroker.ithttps://www.fda.gov/media/152162/download  This test is no t yet approved or cleared by the [place] FDA and  has been authorized for detection and/or diagnosis of SARS-CoV-2 by FDA under an Emergency Use Authorization (EUA). This EUA will remain  in effect (meaning this test can be used) for the duration of the COVID-19 declaration under Section 564(b)(1) of the Act, 21 U.S.C.section 360bbb-3(b)(1), unless the authorization is terminated  or revoked sooner.       Influenza A by PCR NEGATIVE NEGATIVE Final   Influenza B by  PCR NEGATIVE NEGATIVE Final    Comment: (NOTE) The Xpert Xpress SARS-CoV-2/FLU/RSV plus assay is intended as an aid in the diagnosis of influenza from Nasopharyngeal swab specimens and should not be used as a sole basis for treatment. Nasal washings and aspirates are unacceptable for Xpert Xpress SARS-CoV-2/FLU/RSV testing.  Fact Sheet for  Patients: BloggerCourse.com  Fact Sheet for Healthcare Providers: SeriousBroker.it  This test is not yet approved or cleared by the [place] FDA and has been authorized for detection and/or diagnosis of SARS-CoV-2 by FDA under an Emergency Use Authorization (EUA). This EUA will remain in effect (meaning this test can be used) for the duration of the COVID-19 declaration under Section 564(b)(1) of the Act, 21 U.S.C. section 360bbb-3(b)(1), unless the authorization is terminated or revoked.  Performed at [facility], [address] [place], [place] [address]      Labs: BNP (last 3 results) No results for input(s): BNP in the last 8760 hours. Basic Metabolic Panel: Recent Labs  Lab 01/10/22 2158 01/11/22 0230 01/11/22 0605  NA 138  --  135  K 3.4*  --  3.9  CL 101  --  103  CO2 25  --  25  GLUCOSE 79  --  93  BUN 14  --  12  CREATININE 1.51*  --  1.27*  CALCIUM 9.1  --  8.5*  MG  --  1.9 1.9   Liver Function Tests: Recent Labs  Lab 01/10/22 2158 01/11/22 0605  AST 29 25  ALT 30 27  ALKPHOS 85 80  BILITOT 0.6 0.5  PROT 7.7 6.6  ALBUMIN 4.3 3.7   No results for input(s): LIPASE, AMYLASE in the last 168 hours. No results for input(s): AMMONIA in the last 168 hours. CBC: Recent Labs  Lab 01/10/22 2158 01/11/22 0605  WBC 10.1 8.2  NEUTROABS 7.3 5.1  HGB 14.0 12.5*  HCT 45.4 39.8  MCV 81.7 80.4  PLT 275 264   Cardiac Enzymes: Recent Labs  Lab 01/10/22 2158  CKTOTAL 280   BNP: Invalid input(s): POCBNP CBG: Recent Labs  Lab 01/10/22 2256  GLUCAP 86   D-Dimer No results for input(s): DDIMER in the last 72 hours. Hgb A1c No results for input(s): HGBA1C in the last 72 hours. Lipid Profile No results for input(s): CHOL, HDL, LDLCALC, TRIG, CHOLHDL, LDLDIRECT in the last 72 hours. Thyroid function studies Recent Labs    01/11/22 0605  TSH 1.646   Anemia work up No results for  input(s): VITAMINB12, FOLATE, FERRITIN, TIBC, IRON, RETICCTPCT in the last 72 hours. Urinalysis    Component Value Date/Time   COLORURINE YELLOW (A) 01/10/2022 2259   APPEARANCEUR HAZY (A) 01/10/2022 2259   LABSPEC 1.016 01/10/2022 2259   PHURINE 6.0 01/10/2022 2259   GLUCOSEU NEGATIVE 01/10/2022 2259   HGBUR NEGATIVE 01/10/2022 2259   BILIRUBINUR NEGATIVE 01/10/2022 2259   KETONESUR NEGATIVE 01/10/2022 2259   PROTEINUR 30 (A) 01/10/2022 2259   NITRITE NEGATIVE 01/10/2022 2259   LEUKOCYTESUR SMALL (A) 01/10/2022 2259   Sepsis Labs Invalid input(s): PROCALCITONIN,  WBC,  LACTICIDVEN Microbiology Recent Results (from the past 240 hour(s))  Resp Panel by RT-PCR (Flu A&B, Covid) Nasopharyngeal Swab     Status: None   Collection Time: 01/11/22  1:48 AM   Specimen: Nasopharyngeal Swab; Nasopharyngeal(NP) swabs in vial transport medium  Result Value Ref Range Status   SARS Coronavirus 2 by RT PCR NEGATIVE NEGATIVE Final    Comment: (NOTE) SARS-CoV-2 target nucleic acids are NOT DETECTED.  The SARS-CoV-2 RNA is generally detectable in upper respiratory specimens during the acute phase of infection. The lowest concentration of SARS-CoV-2 viral copies this assay can detect is 138 copies/mL. A negative result does not preclude SARS-Cov-2 infection and should not be used as the sole basis for treatment or other patient management decisions. A negative result may occur with  improper specimen collection/handling, submission of specimen other than nasopharyngeal swab, presence of viral mutation(s) within the areas targeted by this assay, and inadequate number of viral copies(<138 copies/mL). A negative result must be combined with clinical observations, patient history, and epidemiological information. The expected result is Negative.  Fact Sheet for Patients:  BloggerCourse.com  Fact Sheet for Healthcare Providers:   SeriousBroker.it  This test is no t yet approved or cleared by the [place] FDA and  has been authorized for detection and/or diagnosis of SARS-CoV-2 by FDA under an Emergency Use Authorization (EUA). This EUA will remain  in effect (meaning this test can be used) for the duration of the COVID-19 declaration under Section 564(b)(1) of the Act, 21 U.S.C.section 360bbb-3(b)(1), unless the authorization is terminated  or revoked sooner.       Influenza A by PCR NEGATIVE NEGATIVE Final   Influenza B by PCR NEGATIVE NEGATIVE Final    Comment: (NOTE) The Xpert Xpress SARS-CoV-2/FLU/RSV plus assay is intended as an aid in the diagnosis of influenza from Nasopharyngeal swab specimens and should not be used as a sole basis for treatment. Nasal washings and aspirates are unacceptable for Xpert Xpress SARS-CoV-2/FLU/RSV testing.  Fact Sheet for Patients: BloggerCourse.com  Fact Sheet for Healthcare Providers: SeriousBroker.it  This test is not yet approved or cleared by the [place] FDA and has been authorized for detection and/or diagnosis of SARS-CoV-2 by FDA under an Emergency Use Authorization (EUA). This EUA will remain in effect (meaning this test can be used) for the duration of the COVID-19 declaration under Section 564(b)(1) of the Act, 21 U.S.C. section 360bbb-3(b)(1), unless the authorization is terminated or revoked.  Performed at [facility], [address] [place], [place] [address]     Please note: You were cared for by a hospitalist during your hospital stay. Once you are discharged, your primary care physician will handle any further medical issues. Please note that NO REFILLS for any discharge medications will be authorized once you are discharged, as it is imperative that you return to your primary care physician (or establish a relationship with a primary care physician  if you do not have one) for your post hospital discharge needs so that they can reassess your need for medications and monitor your lab values.    Time coordinating discharge: 40 minutes  SIGNED:   Burnadette Pop, MD  [facility] 01/11/2022, 8:47 AM Pager [phone]  If 7PM-7AM, please contact night-coverage [email or web address] Password [email or web address]

## 2022-01-11 NOTE — ED Notes (Signed)
Pt given apple juice by request, pt updated on plan to D/C today.

## 2022-01-11 NOTE — ED Notes (Signed)
D/C and reasons to return to ED discussed with pt. Pt ambulatory with steady gait on D/C. Pt refused D/C vitals by this RN stating "I just have to go now". NAD noted.

## 2022-01-11 NOTE — ED Notes (Signed)
Pt resting comfortably at this time. NAD noted. Pt denies any needs at this time. Pt given floor phone to call DJ (message this RN gave pt). VSS. Girlfriend at bedside.  Call bell in reach. IVF infusing at this time. NAD noted. Normal rise and fall of chest.

## 2022-01-23 LAB — METHYLMALONIC ACID, SERUM: Methylmalonic Acid, Quantitative: 69 nmol/L (ref 0–378)

## 2022-08-07 ENCOUNTER — Other Ambulatory Visit: Payer: Self-pay

## 2022-08-07 ENCOUNTER — Emergency Department
Admission: EM | Admit: 2022-08-07 | Discharge: 2022-08-07 | Disposition: A | Discharge status: Home / Self Care | Payer: Self-pay | Attending: Emergency Medicine | Admitting: Emergency Medicine

## 2022-08-07 DIAGNOSIS — E876 Hypokalemia: Secondary | ICD-10-CM | POA: Insufficient documentation

## 2022-08-07 DIAGNOSIS — R778 Other specified abnormalities of plasma proteins: Secondary | ICD-10-CM | POA: Insufficient documentation

## 2022-08-07 DIAGNOSIS — J020 Streptococcal pharyngitis: Secondary | ICD-10-CM | POA: Insufficient documentation

## 2022-08-07 LAB — GROUP A STREP BY PCR: Group A Strep by PCR: DETECTED — AB

## 2022-08-07 MED ORDER — PENICILLIN G BENZATHINE 1200000 UNIT/2ML IM SUSY
600000.0000 [IU] | PREFILLED_SYRINGE | Freq: Once | INTRAMUSCULAR | Status: AC
  Administered 2022-08-07: 600000 [IU] via INTRAMUSCULAR
  Filled 2022-08-07: qty 2

## 2022-08-07 MED ORDER — AMOXICILLIN 875 MG PO TABS: 875.0000 mg | ORAL_TABLET | Freq: Two times a day (BID) | ORAL | 0 refills | Status: AC

## 2022-08-07 MED ORDER — PENICILLIN G BENZATHINE 600000 UNIT/ML IM SUSY: 600000.0000 [IU] | PREFILLED_SYRINGE | Freq: Once | INTRAMUSCULAR | Status: DC

## 2022-08-07 MED ORDER — LIDOCAINE VISCOUS HCL 2 % MT SOLN: 15.0000 mL | Freq: Three times a day (TID) | OROMUCOSAL | 0 refills | Status: AC

## 2022-08-07 MED ORDER — LIDOCAINE VISCOUS HCL 2 % MT SOLN
15.0000 mL | Freq: Once | OROMUCOSAL | Status: AC
Start: 2022-08-07 — End: 2022-08-07
  Administered 2022-08-07: 15 mL via OROMUCOSAL
  Filled 2022-08-07: qty 15

## 2022-08-07 NOTE — ED Provider Notes (Signed)
[facility] Provider Note    Event Date/Time   First MD Initiated Contact with Patient 08/07/22 1446     (approximate)   History   Sore Throat   HPI  Alyjah Lovingood is a 26 y.o. male   presents to the ED with sore throat and fever for the last 4 days.  Patient states that it is difficult to swallow due to his pain and therefore has not been eating.  Patient has a history of acute encephalopathy, acute kidney injury, hypokalemia and elevated troponin.      Physical Exam   Triage Vital Signs: ED Triage Vitals  Enc Vitals Group     BP 08/07/22 1259 124/69     Pulse Rate 08/07/22 1257 94     Resp 08/07/22 1257 20     Temp 08/07/22 1257 100.1 F (37.8 C)     Temp Source 08/07/22 1257 Oral     SpO2 08/07/22 1257 98 %     Weight 08/07/22 1255 235 lb (106.6 kg)     Height 08/07/22 1255 5\' 10"  (1.778 m)     Head Circumference --      Peak Flow --      Pain Score 08/07/22 1255 10     Pain Loc --      Pain Edu? --      Excl. in GC? --     Most recent vital signs: Vitals:   08/07/22 1257 08/07/22 1259  BP:  124/69  Pulse: 94   Resp: 20   Temp: 100.1 F (37.8 C)   SpO2: 98%      General: Awake, no distress.  CV:  Good peripheral perfusion.  Resp:  Normal effort.  Abd:  No distention.  Other:  Posterior pharynx erythematous with no exudate present.  Uvula is midline.  Minimal cervical lymphadenopathy appreciated.  Patient is able to talk in complete sentences without any difficulty.   ED Results / Procedures / Treatments   Labs (all labs ordered are listed, but only abnormal results are displayed) Labs Reviewed  GROUP A STREP BY PCR - Abnormal; Notable for the following components:      Result Value   Group A Strep by PCR DETECTED (*)    All other components within normal limits      PROCEDURES:  Critical Care performed:   Procedures   MEDICATIONS ORDERED IN ED: Medications  lidocaine (XYLOCAINE) 2 % viscous mouth solution  15 mL (15 mLs Mouth/Throat Given 08/07/22 1509)  penicillin g benzathine (BICILLIN LA) 1200000 UNIT/2ML injection 600,000 Units (600,000 Units Intramuscular Given 08/07/22 1510)     IMPRESSION / MDM / ASSESSMENT AND PLAN / ED COURSE  I reviewed the triage vital signs and the nursing notes.   Differential diagnosis includes, but is not limited to, strep pharyngitis, viral pharyngitis, tonsillitis.   26 year old male presents to the ED with complaint of sore throat and subjective fever for the last 4 days.  Examination of the throat as noted above.  Patient reports that he is having a great deal of throat discomfort and was given viscous lidocaine while in the ED and an injection of Bicillin.  A prescription for amoxicillin 875 twice daily was sent to the pharmacy along with lidocaine solution before meals and at bedtime for throat pain.  He is to follow-up with Dr. 34 if any continued problems or not improving.     Patient's presentation is most consistent with acute complicated illness / injury requiring  diagnostic workup.  FINAL CLINICAL IMPRESSION(S) / ED DIAGNOSES   Final diagnoses:  Strep pharyngitis     Rx / DC Orders   ED Discharge Orders          Ordered    amoxicillin (AMOXIL) 875 MG tablet  2 times daily        08/07/22 1508    lidocaine (XYLOCAINE) 2 % solution  3 times daily before meals & bedtime        08/07/22 1508             Note:  This document was prepared using Dragon voice recognition software and may include unintentional dictation errors.   Johnn Hai, PA-C 08/07/22 1517    Blake Divine, MD 08/07/22 1944

## 2022-08-07 NOTE — Discharge Instructions (Addendum)
Follow-up with Dr. Tami Ribas if any continued problems or not improving.  You may take Tylenol or ibuprofen as needed for throat pain, body aches for fever.  Lidocaine was sent to the pharmacy to use before meals and at bedtime as needed for throat pain along with a prescription for amoxicillin.  You are contagious for 24 hours.  Increase fluids to stay hydrated.  Also consider eating soft foods such as yogurt, ice cream, soups at this time.  Also in approximately 3 days throw your current toothbrush away and begin using a new toothbrush as the one that you are using at this time has a strep germ on the toothbrush.

## 2022-08-07 NOTE — ED Triage Notes (Signed)
Pt with c/o sore throat and fever since this past Monday, pt states he thinks he has been having fevers but does not have a thermometer.

## 2022-08-12 IMAGING — CT CT HEAD W/O CM
3 of 4 series · 13 of 47 positions shown, 15 images · non-contrast
Comparison: Head CT and cervical spine CT both 02/25/2018

CLINICAL DATA: Head and neck trauma.



[Series 2: head wo · axial · 0.43mm/px · z∈[+82,+197]mm · 7 of 31 slices shown, 9 images]
[im 4/31  brain]
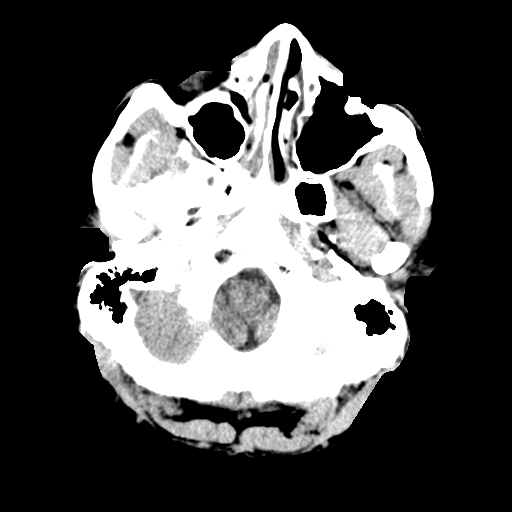
[im 4/31  bone]
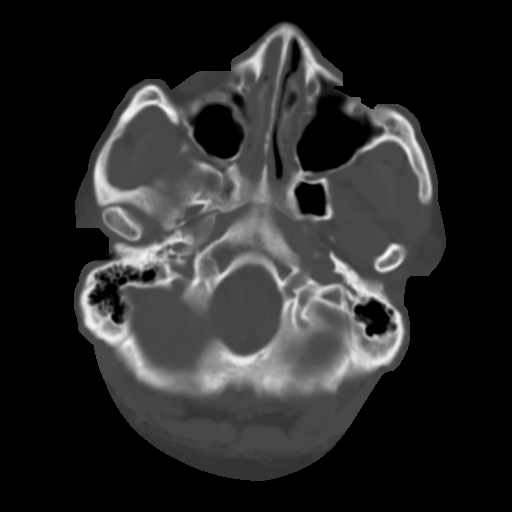
[im 8/31  brain]
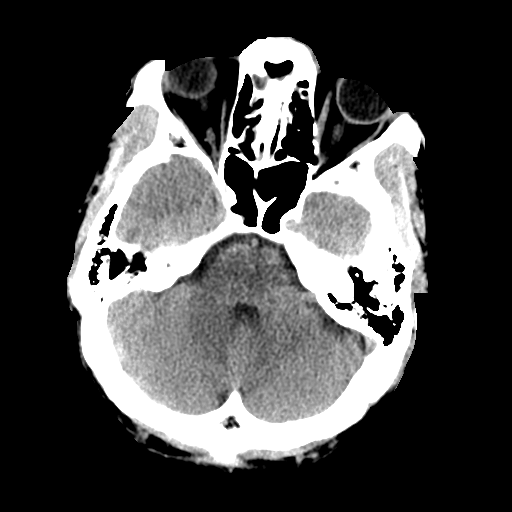
[im 12/31  brain]
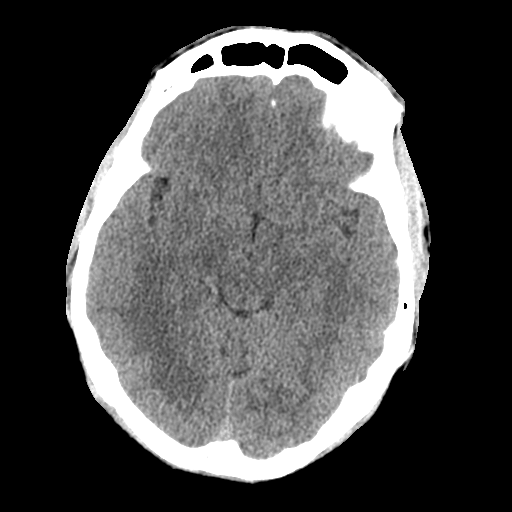
[im 16/31  brain]
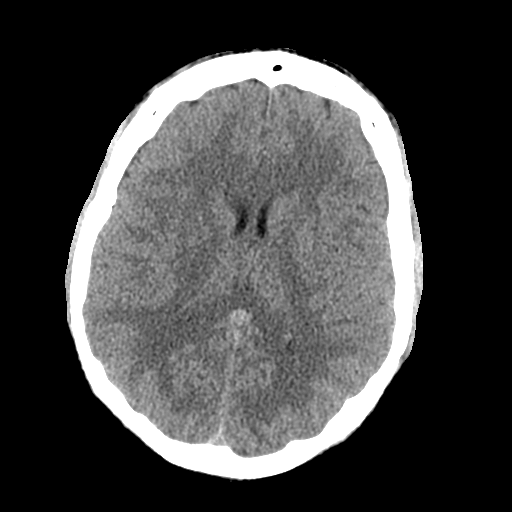
[im 19/31  brain]
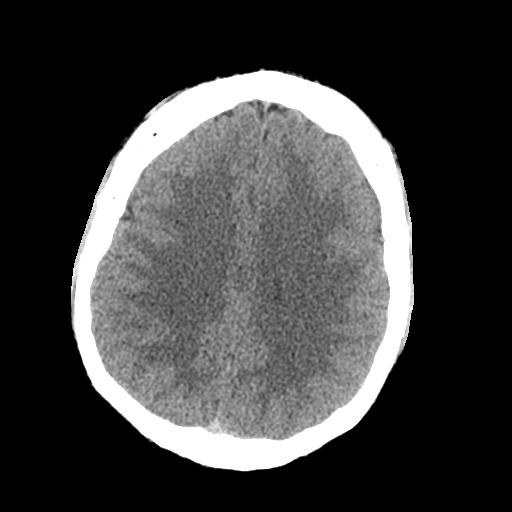
[im 19/31  bone]
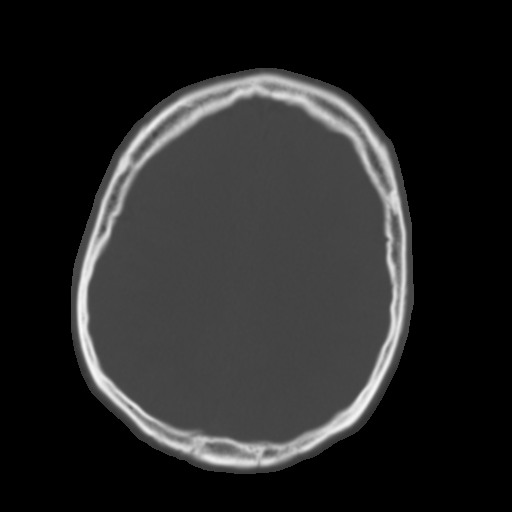
[im 23/31  brain]
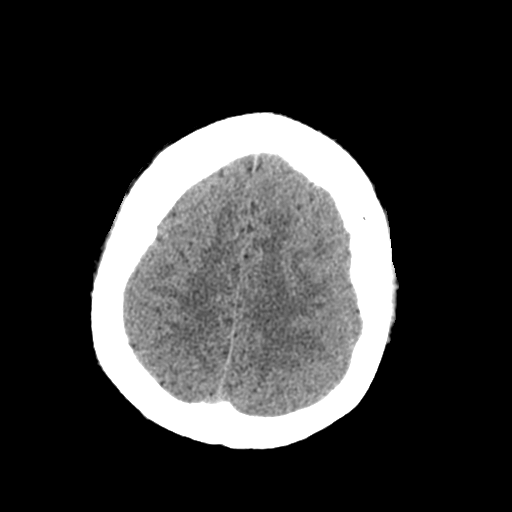
[im 27/31  brain]
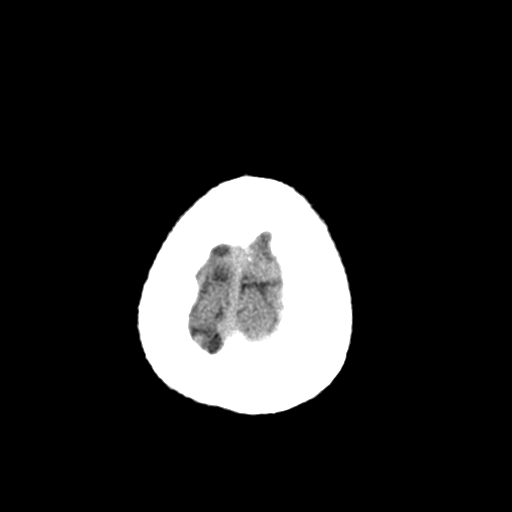

[Series 4: coronal soft tissue · coronal · 0.31mm/px · 3 of 69 slices shown]
[im 23/69  brain]
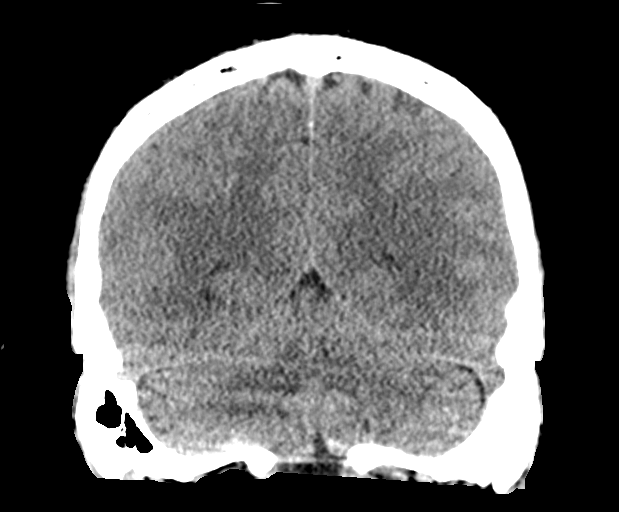
[im 31/69  brain]
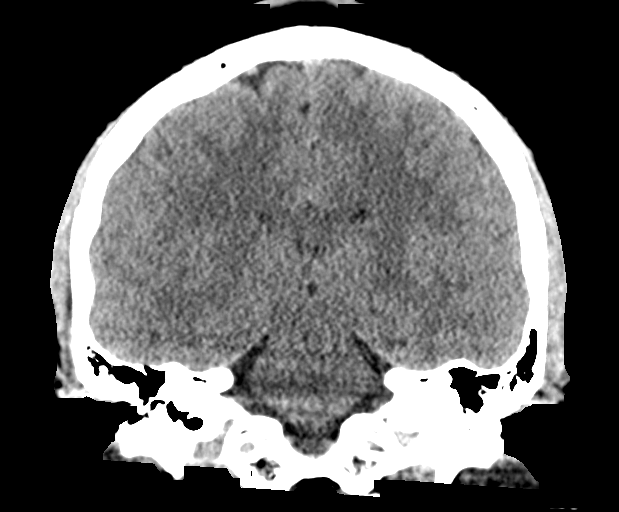
[im 38/69  brain]
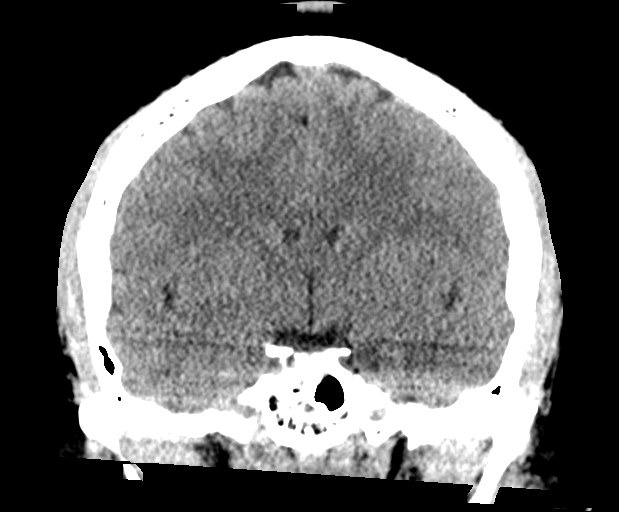

[Series 5: sagittal soft tissue · sagittal · 0.31mm/px · 3 of 60 slices shown]
[im 20/60  brain]
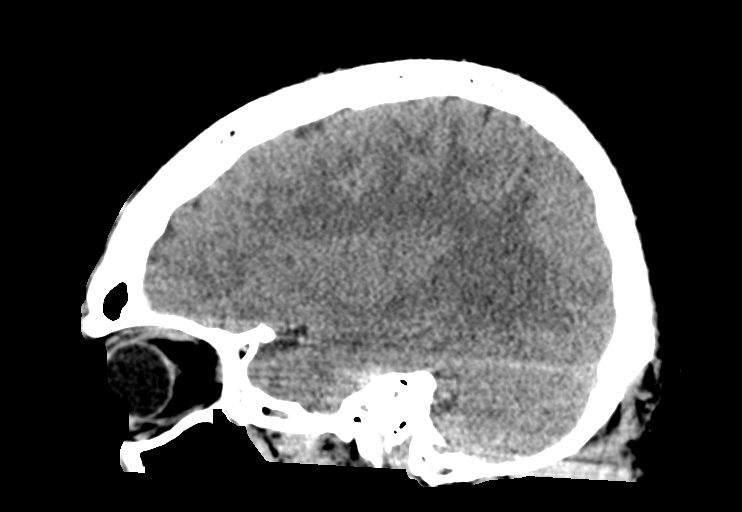
[im 30/60  brain]
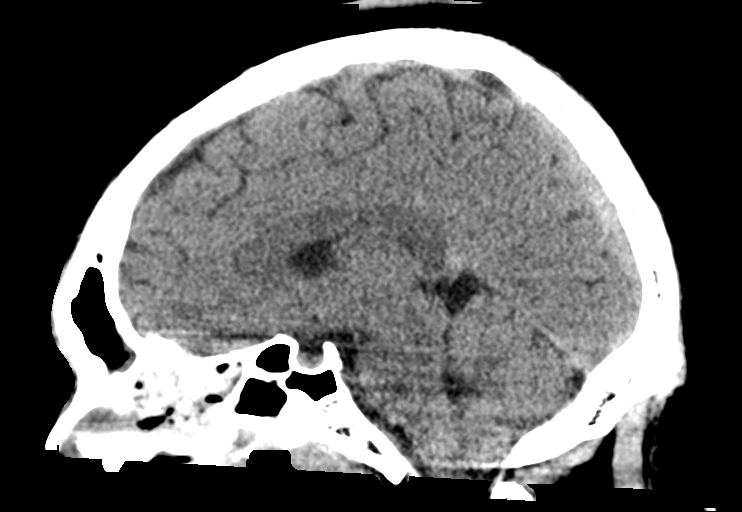
[im 40/60  brain]
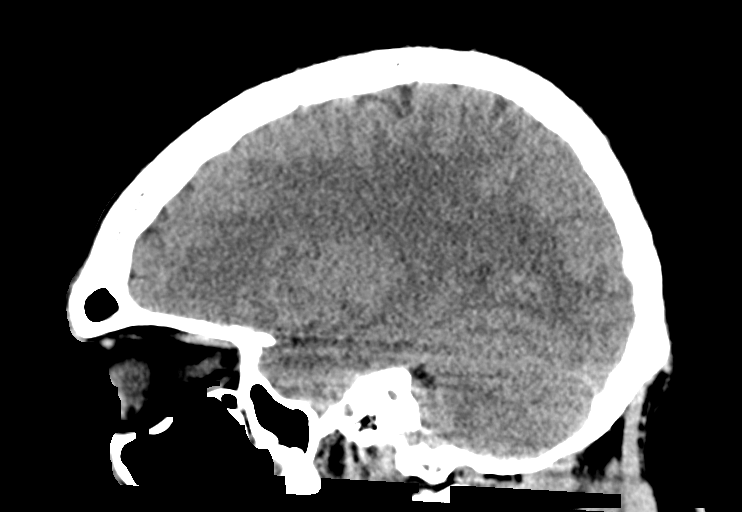

[13 of 47 positions shown; findings below may reference images not displayed]

FINDINGS: CT HEAD FINDINGS

Brain: No evidence of acute infarction, hemorrhage, hydrocephalus,
extra-axial collection or mass lesion/mass effect.

Vascular: No hyperdense vessel or unexpected calcification.

Skull: The calvarium, skull base and orbits are intact. No skull
lesion is seen. There is no visible scalp hematoma.

Sinuses/Orbits: There is increased mild-to-moderate membrane
thickening throughout the paranasal sinuses compared to the prior
study. There are no fluid levels. There is no mastoid effusion.
Right-sided nasal septal deviation and spurring are again shown.
Orbital contents unremarkable.

Other: None.

CT CERVICAL SPINE FINDINGS

Alignment: There is again noted slight cervical kyphodextroscoliosis
without evidence of listhesis, unchanged.

Skull base and vertebrae: No fracture or focal bone lesion is seen.

Soft tissues and spinal canal: No prevertebral fluid or swelling. No
visible canal hematoma. Please note however, the spinal canal is not
well seen below the level of C4 due to beam hardening artifact from
the patient's superimposed shoulders. The palatine tonsils are both
enlarged, but this was also seen previously. The adenoidal eminence
is also enlarged.

Disc levels: There is preservation of the normal disc and vertebral
body heights. Trace endplate spurring is beginning to develop at
C5-6 and C6-7 without appreciable significant canal encroachment.
Cervical foramina appear patent. Arthritic changes are not seen.

Upper chest: Negative.

Other: None.
IMPRESSION: 1. No acute intracranial CT findings or depressed skull fractures.
2. Sinus membrane disease without fluid levels, new from 3723.
3. Slight cervical kyphodextroscoliosis without evidence of
fractures or listhesis but with limited image detail below C4.
4. Enlarged adenoids and palatine tonsils, also seen previously.

## 2022-08-13 IMAGING — DX DG CHEST 1V PORT
1 series · 1 of 1 positions shown · non-contrast
Comparison: 07/12/2018

CLINICAL DATA: Syncope

EXAM:
PORTABLE CHEST 1 VIEW

[chest ap]
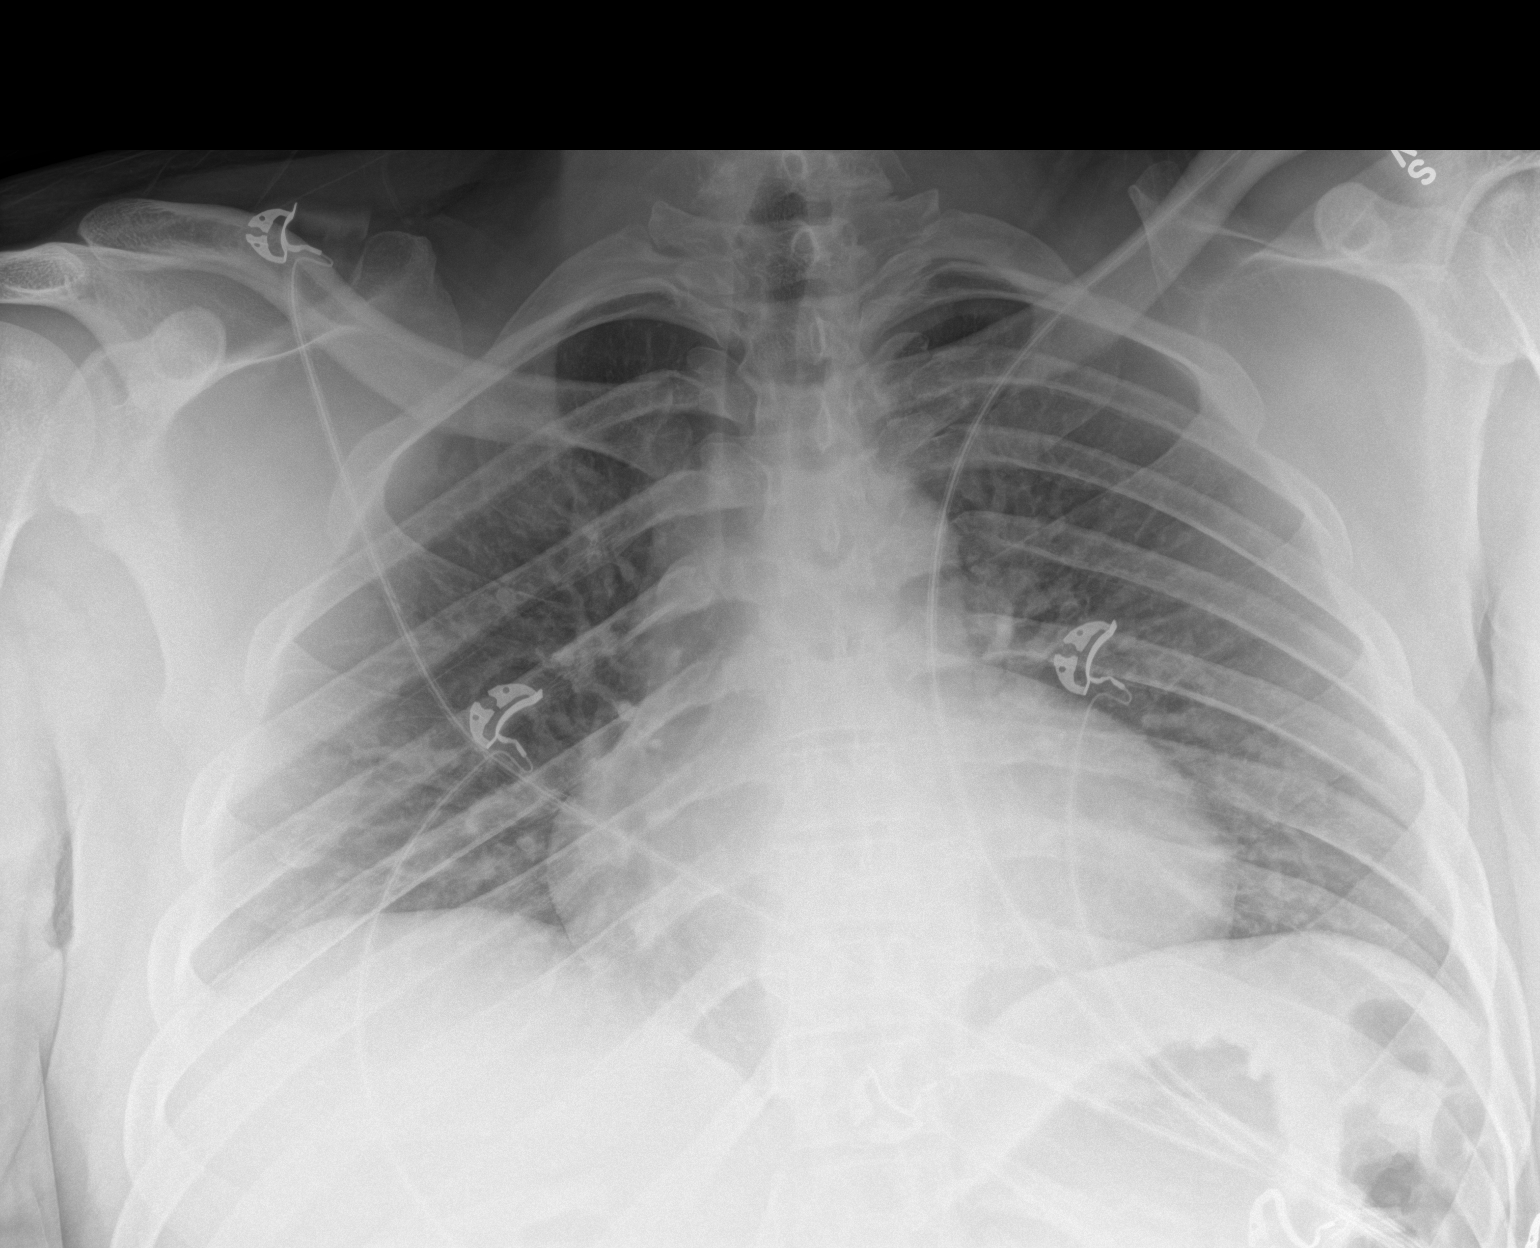

[1 of 1 positions shown; findings below may reference images not displayed]

FINDINGS: Low lung volumes. Heart and mediastinal contours are within normal
limits. No focal opacities or effusions. No acute bony abnormality.
IMPRESSION: No active disease.

## 2023-02-13 DIAGNOSIS — F17210 Nicotine dependence, cigarettes, uncomplicated: Secondary | ICD-10-CM | POA: Diagnosis not present

## 2023-02-13 DIAGNOSIS — K529 Noninfective gastroenteritis and colitis, unspecified: Secondary | ICD-10-CM | POA: Diagnosis not present

## 2023-02-13 DIAGNOSIS — R197 Diarrhea, unspecified: Secondary | ICD-10-CM | POA: Diagnosis not present

## 2023-02-13 DIAGNOSIS — R1110 Vomiting, unspecified: Secondary | ICD-10-CM | POA: Diagnosis not present

## 2023-06-10 DIAGNOSIS — J45909 Unspecified asthma, uncomplicated: Secondary | ICD-10-CM | POA: Diagnosis not present

## 2023-06-10 DIAGNOSIS — M549 Dorsalgia, unspecified: Secondary | ICD-10-CM | POA: Diagnosis not present

## 2023-06-10 DIAGNOSIS — M5441 Lumbago with sciatica, right side: Secondary | ICD-10-CM | POA: Diagnosis not present

## 2023-06-10 DIAGNOSIS — M545 Low back pain, unspecified: Secondary | ICD-10-CM | POA: Diagnosis not present

## 2023-06-10 DIAGNOSIS — F4310 Post-traumatic stress disorder, unspecified: Secondary | ICD-10-CM | POA: Diagnosis not present

## 2023-06-10 DIAGNOSIS — F17210 Nicotine dependence, cigarettes, uncomplicated: Secondary | ICD-10-CM | POA: Diagnosis not present

## 2023-08-10 DIAGNOSIS — Z13220 Encounter for screening for lipoid disorders: Secondary | ICD-10-CM | POA: Diagnosis not present

## 2023-08-10 DIAGNOSIS — M2550 Pain in unspecified joint: Secondary | ICD-10-CM | POA: Diagnosis not present

## 2023-08-10 DIAGNOSIS — M5430 Sciatica, unspecified side: Secondary | ICD-10-CM | POA: Diagnosis not present

## 2023-08-10 DIAGNOSIS — Z131 Encounter for screening for diabetes mellitus: Secondary | ICD-10-CM | POA: Diagnosis not present

## 2023-08-10 DIAGNOSIS — Z1159 Encounter for screening for other viral diseases: Secondary | ICD-10-CM | POA: Diagnosis not present

## 2023-08-10 DIAGNOSIS — Z1389 Encounter for screening for other disorder: Secondary | ICD-10-CM | POA: Diagnosis not present

## 2023-08-10 DIAGNOSIS — J4520 Mild intermittent asthma, uncomplicated: Secondary | ICD-10-CM | POA: Diagnosis not present

## 2024-05-11 ENCOUNTER — Other Ambulatory Visit: Payer: Self-pay

## 2024-05-11 ENCOUNTER — Emergency Department: Payer: Self-pay

## 2024-05-11 ENCOUNTER — Emergency Department
Admission: EM | Admit: 2024-05-11 | Discharge: 2024-05-11 | Disposition: A | Discharge status: Home / Self Care | Payer: Self-pay | Attending: Emergency Medicine | Admitting: Emergency Medicine

## 2024-05-11 DIAGNOSIS — R42 Dizziness and giddiness: Secondary | ICD-10-CM

## 2024-05-11 DIAGNOSIS — T679XXA Effect of heat and light, unspecified, initial encounter: Secondary | ICD-10-CM | POA: Insufficient documentation

## 2024-05-11 DIAGNOSIS — J4521 Mild intermittent asthma with (acute) exacerbation: Secondary | ICD-10-CM | POA: Insufficient documentation

## 2024-05-11 LAB — BASIC METABOLIC PANEL WITH GFR
Anion gap: 8 (ref 5–15)
BUN: 12 mg/dL (ref 6–20)
CO2: 24 mmol/L (ref 22–32)
Calcium: 9.4 mg/dL (ref 8.9–10.3)
Chloride: 106 mmol/L (ref 98–111)
Creatinine, Ser: 1.31 mg/dL — ABNORMAL HIGH (ref 0.61–1.24)
GFR, Estimated: >60 mL/min (ref >60)
Glucose, Bld: 90 mg/dL (ref 70–99)
Potassium: 3.8 mmol/L (ref 3.5–5.1)
Sodium: 138 mmol/L (ref 135–145)

## 2024-05-11 LAB — CBC
HCT: 40.4 % (ref 39.0–52.0)
Hemoglobin: 13.2 g/dL (ref 13.0–17.0)
MCH: 26.2 pg (ref 26.0–34.0)
MCHC: 32.7 g/dL (ref 30.0–36.0)
MCV: 80.3 fL (ref 80.0–100.0)
Platelets: 274 10*3/uL (ref 150–400)
RBC: 5.03 MIL/uL (ref 4.22–5.81)
RDW: 14.4 % (ref 11.5–15.5)
WBC: 6.9 10*3/uL (ref 4.0–10.5)
nRBC: 0 % (ref 0.0–0.2)

## 2024-05-11 LAB — TROPONIN I (HIGH SENSITIVITY): Troponin I (High Sensitivity): 8 ng/L (ref <18)

## 2024-05-11 MED ORDER — ALBUTEROL SULFATE HFA 108 (90 BASE) MCG/ACT IN AERS: 2.0000 | INHALATION_SPRAY | Freq: Four times a day (QID) | RESPIRATORY_TRACT | 2 refills | Status: AC | PRN

## 2024-05-11 MED ORDER — IPRATROPIUM-ALBUTEROL 0.5-2.5 (3) MG/3ML IN SOLN
3.0000 mL | Freq: Once | RESPIRATORY_TRACT | Status: AC
  Administered 2024-05-11: 3 mL via RESPIRATORY_TRACT
  Filled 2024-05-11: qty 3

## 2024-05-11 NOTE — ED Provider Notes (Signed)
 [facility] Provider Note    Event Date/Time   First MD Initiated Contact with Patient 05/11/24 1924     (approximate)   History   Shortness of Breath   HPI  Greg Benjamin is a 28 y.o. male who presents to the ED for evaluation of Shortness of Breath   Patient with history of mild intermittent asthma presents to the ED alongside his mother for evaluation of dizziness and shortness of breath.  Symptoms started this afternoon (very hot day today about 100 degrees) during a traffic stop.  He got pulled over and lawn for cement got him out of the vehicle and made him sit outside on the curb for a while and this is when his symptoms started.  No trauma, syncope.  No nausea or emesis, diarrhea.  No recent illnesses.  No pain such as chest, back or abdominal pain.   Physical Exam   Triage Vital Signs: ED Triage Vitals [05/11/24 1724]  Encounter Vitals Group     BP 133/79     Girls Systolic BP Percentile      Girls Diastolic BP Percentile      Boys Systolic BP Percentile      Boys Diastolic BP Percentile      Pulse Rate 87     Resp 17     Temp 98.4 F (36.9 C)     Temp Source Oral     SpO2 99 %     Weight 256 lb (116.1 kg)     Height 6' 2 (1.88 m)     Head Circumference      Peak Flow      Pain Score 0     Pain Loc      Pain Education      Exclude from Growth Chart     Most recent vital signs: Vitals:   05/11/24 1724  BP: 133/79  Pulse: 87  Resp: 17  Temp: 98.4 F (36.9 C)  SpO2: 99%    General: Awake, no distress.  Well-appearing CV:  Good peripheral perfusion.  Resp:  Normal effort.  Faint and scattered expiratory wheezes.  Good airflow. Abd:  No distention.  Soft and benign MSK:  No deformity noted.  Neuro:  No focal deficits appreciated. Cranial nerves II through XII intact 5/5 strength and sensation in all 4 extremities Other:     ED Results / Procedures / Treatments   Labs (all labs ordered are listed, but only abnormal  results are displayed) Labs Reviewed  BASIC METABOLIC PANEL WITH GFR - Abnormal; Notable for the following components:      Result Value   Creatinine, Ser 1.31 (*)    All other components within normal limits  CBC  TROPONIN I (HIGH SENSITIVITY)  TROPONIN I (HIGH SENSITIVITY)    EKG Sinus rhythm with a rate of 80 bpm.  Normal axis and intervals without clear signs of acute ischemia.  No comparison  RADIOLOGY CXR interpreted by me without evidence of acute cardiopulmonary pathology.  Official radiology report(s): DG Chest 2 View Result Date: 05/11/2024 CLINICAL DATA:  Shortness of breath and near syncope during a traffic stop. EXAM: CHEST - 2 VIEW COMPARISON:  None Available. FINDINGS: Low lung volumes are present, causing crowding of the pulmonary vasculature. Mild enlargement of the cardiopericardial silhouette with cardiothoracic ratio of 0.54. The lungs appear clear.  No blunting of the costophrenic angles. IMPRESSION: 1. Mild enlargement of the cardiopericardial silhouette. 2. Low lung volumes. Electronically Signed  By: Ryan Salvage M.D.   On: 05/11/2024 17:48    PROCEDURES and INTERVENTIONS:  Procedures  Medications  ipratropium-albuterol  (DUONEB) 0.5-2.5 (3) MG/3ML nebulizer solution 3 mL (3 mLs Nebulization Given 05/11/24 2001)     IMPRESSION / MDM / ASSESSMENT AND PLAN / ED COURSE  I reviewed the triage vital signs and the nursing notes.  Differential diagnosis includes, but is not limited to, cardiac dysrhythmia, hypercarbia or asthma exacerbation, acute stress reaction, heat exposure or exhaustion  {Patient presents with symptoms of an acute illness or injury that is potentially life-threatening.  Patient presents with evidence of a mild asthma exacerbation and heat exposure causing some dizziness and shortness of breath.  He looks well to me.  Minimal wheezing on exam.  Will provide a single breathing treatment, p.o. rehydration and reassess.  Suspect will be  suitable for outpatient management.  Normal CBC, metabolic panel and troponin.  Doubt ACS or more severe pathology  Clinical Course as of 05/11/24 2019  Wed May 11, 2024  2017 Reassessed, feeling much better after drinking a full cup of ice water and a breathing treatment. [DS]    Clinical Course User Index [DS] Claudene Rover, MD     FINAL CLINICAL IMPRESSION(S) / ED DIAGNOSES   Final diagnoses:  Dizziness  Heat exposure, initial encounter  Mild intermittent asthma with exacerbation     Rx / DC Orders   ED Discharge Orders          Ordered    albuterol  (VENTOLIN  HFA) 108 (90 Base) MCG/ACT inhaler  Every 6 hours PRN        05/11/24 2018             Note:  This document was prepared using Dragon voice recognition software and may include unintentional dictation errors.   Claudene Rover, MD 05/11/24 2019

## 2024-05-11 NOTE — ED Notes (Signed)
 Per provider Claudene, MD, hold on redraw for troponin at this time

## 2024-05-11 NOTE — ED Triage Notes (Signed)
 Pt sts that he was getting pulled over by a [employer] and pt sts that he felt like he was going to faint and then he got very SOB.

## 2024-07-26 ENCOUNTER — Emergency Department: Payer: Self-pay

## 2024-07-26 ENCOUNTER — Other Ambulatory Visit: Payer: Self-pay

## 2024-07-26 ENCOUNTER — Emergency Department
Admission: EM | Admit: 2024-07-26 | Discharge: 2024-07-26 | Disposition: A | Discharge status: Home / Self Care | Payer: Self-pay | Attending: Emergency Medicine | Admitting: Emergency Medicine

## 2024-07-26 DIAGNOSIS — R1013 Epigastric pain: Secondary | ICD-10-CM | POA: Insufficient documentation

## 2024-07-26 DIAGNOSIS — U071 COVID-19: Secondary | ICD-10-CM | POA: Insufficient documentation

## 2024-07-26 LAB — CBC
HCT: 41.7 % (ref 39.0–52.0)
Hemoglobin: 13.5 g/dL (ref 13.0–17.0)
MCH: 25.6 pg — ABNORMAL LOW (ref 26.0–34.0)
MCHC: 32.4 g/dL (ref 30.0–36.0)
MCV: 79.1 fL — ABNORMAL LOW (ref 80.0–100.0)
Platelets: 296 K/uL (ref 150–400)
RBC: 5.27 MIL/uL (ref 4.22–5.81)
RDW: 14.2 % (ref 11.5–15.5)
WBC: 7.2 K/uL (ref 4.0–10.5)
nRBC: 0 % (ref 0.0–0.2)

## 2024-07-26 LAB — COMPREHENSIVE METABOLIC PANEL WITH GFR
ALT: 38 U/L (ref 0–44)
AST: 39 U/L (ref 15–41)
Albumin: 3.9 g/dL (ref 3.5–5.0)
Alkaline Phosphatase: 82 U/L (ref 38–126)
Anion gap: 14 (ref 5–15)
BUN: 19 mg/dL (ref 6–20)
CO2: 23 mmol/L (ref 22–32)
Calcium: 9.3 mg/dL (ref 8.9–10.3)
Chloride: 104 mmol/L (ref 98–111)
Creatinine, Ser: 1 mg/dL (ref 0.61–1.24)
GFR, Estimated: >60 mL/min (ref >60)
Glucose, Bld: 110 mg/dL — ABNORMAL HIGH (ref 70–99)
Potassium: 3.7 mmol/L (ref 3.5–5.1)
Sodium: 141 mmol/L (ref 135–145)
Total Bilirubin: 0.7 mg/dL (ref 0.0–1.2)
Total Protein: 7.2 g/dL (ref 6.5–8.1)

## 2024-07-26 LAB — TYPE AND SCREEN
ABO/RH(D): O POS
Antibody Screen: NEGATIVE

## 2024-07-26 LAB — RESP PANEL BY RT-PCR (RSV, FLU A&B, COVID)  RVPGX2
Influenza A by PCR: NEGATIVE
Influenza B by PCR: NEGATIVE
Resp Syncytial Virus by PCR: NEGATIVE
SARS Coronavirus 2 by RT PCR: POSITIVE — AB

## 2024-07-26 LAB — URINALYSIS, ROUTINE W REFLEX MICROSCOPIC
Bilirubin Urine: NEGATIVE
Glucose, UA: NEGATIVE mg/dL
Hgb urine dipstick: NEGATIVE
Ketones, ur: NEGATIVE mg/dL
Nitrite: NEGATIVE
Protein, ur: NEGATIVE mg/dL
Specific Gravity, Urine: 1.029 (ref 1.005–1.030)
pH: 7 (ref 5.0–8.0)

## 2024-07-26 LAB — LIPASE, BLOOD: Lipase: 29 U/L (ref 11–51)

## 2024-07-26 MED ORDER — SUCRALFATE 1 G PO TABS: 1.0000 g | ORAL_TABLET | Freq: Three times a day (TID) | ORAL | 0 refills | Status: AC

## 2024-07-26 MED ORDER — ONDANSETRON 8 MG PO TBDP: 8.0000 mg | ORAL_TABLET | Freq: Three times a day (TID) | ORAL | 0 refills | Status: AC | PRN

## 2024-07-26 NOTE — ED Triage Notes (Signed)
 To ED from home [facility] for sharp bilateral upper abdominal pain since 2 weeks, worse this AM with vomiting (2 times this AM). Also black stool since 2 weeks. Also had sore throat recently and had chills and cold sweats. States pain moved into chest this AM and felt like couldn't breathe.  EMS VS: 121/89, 98%, HR 60, 98.4  Pt alert, skin dry, respirations unlabored. No acute distress noted.

## 2024-07-26 NOTE — ED Provider Notes (Signed)
 [facility] Provider Note   Event Date/Time   First MD Initiated Contact with Patient 07/26/24 0825     (approximate) History  Abdominal Pain  HPI Greg Benjamin is a 28 y.o. male with no stated past medical history presents for bilateral upper abdominal pain that is worse in the epigastric region has been present for 2 weeks.  Patient states that he has had worsening of this pain this morning with associated vomiting.  Patient also states that he has had black tarry stools throughout this week that have been diarrhea as well as well-formed stool.  This morning, patient states that the burning pain from the epigastric region moved up into his chest but is now down in his abdomen again.  Patient states that 2 weeks ago he had strep throat and after that had the flu however patient was never tested for either and has not received any antibiotics.  Patient currently denies any sore throat, fever ROS: Patient currently denies any vision changes, tinnitus, difficulty speaking, facial droop, sore throat, chest pain, shortness of breath, dysuria, or weakness/numbness/paresthesias in any extremity   Physical Exam  Triage Vital Signs: ED Triage Vitals  Encounter Vitals Group     BP 07/26/24 0813 (!) 142/89     Girls Systolic BP Percentile --      Girls Diastolic BP Percentile --      Boys Systolic BP Percentile --      Boys Diastolic BP Percentile --      Pulse Rate 07/26/24 0813 60     Resp 07/26/24 0813 20     Temp 07/26/24 0813 97.8 F (36.6 C)     Temp Source 07/26/24 0813 Oral     SpO2 07/26/24 0813 98 %     Weight 07/26/24 0811 260 lb (117.9 kg)     Height 07/26/24 0811 6' 2 (1.88 m)     Head Circumference --      Peak Flow --      Pain Score 07/26/24 0807 7     Pain Loc --      Pain Education --      Exclude from Growth Chart --    Most recent vital signs: Vitals:   07/26/24 0813  BP: (!) 142/89  Pulse: 60  Resp: 20  Temp: 97.8 F (36.6 C)   SpO2: 98%   General: Awake, oriented x4. CV:  Good peripheral perfusion. Resp:  Normal effort. Abd:  No distention.  Epigastric tenderness to palpation Other:  Middle-aged overweight African-American male resting comfortably in no acute distress ED Results / Procedures / Treatments  Labs (all labs ordered are listed, but only abnormal results are displayed) Labs Reviewed  RESP PANEL BY RT-PCR (RSV, FLU A&B, COVID)  [id number] - Abnormal; Notable for the following components:      Result Value   SARS Coronavirus 2 by RT PCR POSITIVE (*)    All other components within normal limits  COMPREHENSIVE METABOLIC PANEL WITH GFR - Abnormal; Notable for the following components:   Glucose, Bld 110 (*)    All other components within normal limits  CBC - Abnormal; Notable for the following components:   MCV 79.1 (*)    MCH 25.6 (*)    All other components within normal limits  URINALYSIS, ROUTINE W REFLEX MICROSCOPIC - Abnormal; Notable for the following components:   Color, Urine YELLOW (*)    APPearance HAZY (*)    Leukocytes,Ua SMALL (*)    Bacteria, UA  RARE (*)    All other components within normal limits  LIPASE, BLOOD  TYPE AND SCREEN   EKG ED ECG REPORT I, Artist MARLA Kerns, the attending physician, personally viewed and interpreted this ECG. Date: 07/26/2024 EKG Time: 0812 Rate: 56 Rhythm: Bradycardic sinus rhythm QRS Axis: normal Intervals: normal ST/T Wave abnormalities: normal Narrative Interpretation: Bradycardic sinus rhythm.  No evidence of acute ischemia RADIOLOGY ED MD interpretation: Chest x-ray shows no acute cardiopulmonary abnormality - All radiology independently interpreted and agree with radiology assessment Official radiology report(s): DG Chest Port 1 View Result Date: 07/26/2024 CLINICAL DATA:  chest pain, sob EXAM: PORTABLE CHEST - 1 VIEW COMPARISON:  January 11 2022 FINDINGS: No focal airspace consolidation, pleural effusion, or pneumothorax. The cardiac  silhouette is at the upper limits of normal, likely accentuated by AP technique.No acute fracture or destructive lesion. IMPRESSION: No acute cardiopulmonary abnormality. Electronically Signed   By: Rogelia Myers M.D.   On: 07/26/2024 08:54   PROCEDURES: Critical Care performed: No Procedures MEDICATIONS ORDERED IN ED: Medications - No data to display IMPRESSION / MDM / ASSESSMENT AND PLAN / ED COURSE  I reviewed the triage vital signs and the nursing notes.                             The patient is on the cardiac monitor to evaluate for evidence of arrhythmia and/or significant heart rate changes. Patient's presentation is most consistent with acute presentation with potential threat to life or bodily function. Patient is a 28 year old male with the above-stated past medical history who presents complaining of epigastric abdominal pain with dark tarry stools over the past 2 weeks. DDx: Acute upper GI bleeding, symptomatic anemia, viral infection, reflux, Mallory-Weiss tear, Boerhaave syndrome Plan: CBC, CMP, lipase, UA, RVP, type and screen, chest x-ray, EKG  RVP positive for COVID.  Patient was unable to give a stool sample throughout his emergency department course.  Patient agrees with plan for follow-up with GI and primary care physician for a fecal occult blood test as soon as possible.  Patient was informed on his positive COVID test and agrees with plan for discharge home and plan to quarantine from his family.  All questions were answered prior to discharge.  Will provide prescription for sucralfate  and Zofran .  Dispo: Discharge home with PCP and GI follow-up   FINAL CLINICAL IMPRESSION(S) / ED DIAGNOSES   Final diagnoses:  Epigastric pain  COVID-19 virus infection   Rx / DC Orders   ED Discharge Orders          Ordered    ondansetron  (ZOFRAN -ODT) 8 MG disintegrating tablet  Every 8 hours PRN        07/26/24 1147    sucralfate  (CARAFATE ) 1 g tablet  3 times daily with  meals & bedtime        07/26/24 1147           Note:  This document was prepared using Dragon voice recognition software and may include unintentional dictation errors.   Kerns Artist MARLA, MD 07/26/24 1149
